# Patient Record
Sex: Male | Born: 1985 | Hispanic: No | Marital: Married | Smoking: Never smoker
Health system: Southern US, Community
[De-identification: ages and names within clinical notes are randomized; demographics above are authoritative.]

## PROBLEM LIST (undated history)

## (undated) DIAGNOSIS — E785 Hyperlipidemia, unspecified: Secondary | ICD-10-CM

## (undated) DIAGNOSIS — K219 Gastro-esophageal reflux disease without esophagitis: Secondary | ICD-10-CM

## (undated) DIAGNOSIS — L501 Idiopathic urticaria: Secondary | ICD-10-CM

## (undated) DIAGNOSIS — K589 Irritable bowel syndrome without diarrhea: Secondary | ICD-10-CM

## (undated) HISTORY — DX: Hyperlipidemia, unspecified: E78.5

## (undated) HISTORY — DX: Gastro-esophageal reflux disease without esophagitis: K21.9

## (undated) HISTORY — DX: Idiopathic urticaria: L50.1

## (undated) HISTORY — DX: Irritable bowel syndrome, unspecified: K58.9

---

## 2017-06-24 HISTORY — PX: ESOPHAGOGASTRODUODENOSCOPY: SHX1529

## 2017-09-22 HISTORY — PX: COLONOSCOPY: SHX174

## 2019-10-18 ENCOUNTER — Encounter: Payer: Self-pay | Admitting: Gastroenterology

## 2019-11-01 ENCOUNTER — Telehealth: Payer: Self-pay

## 2019-11-01 NOTE — Telephone Encounter (Signed)
Called patient and left voicemail to call back regarding OV

## 2019-11-02 ENCOUNTER — Other Ambulatory Visit: Payer: Self-pay

## 2019-11-02 ENCOUNTER — Ambulatory Visit (INDEPENDENT_AMBULATORY_CARE_PROVIDER_SITE_OTHER): Payer: 59 | Admitting: Gastroenterology

## 2019-11-02 ENCOUNTER — Encounter: Payer: Self-pay | Admitting: Gastroenterology

## 2019-11-02 VITALS — BP 122/80 | HR 84 | Temp 97.7°F | Ht 68.0 in | Wt 157.5 lb

## 2019-11-02 DIAGNOSIS — R140 Abdominal distension (gaseous): Secondary | ICD-10-CM

## 2019-11-02 DIAGNOSIS — K581 Irritable bowel syndrome with constipation: Secondary | ICD-10-CM | POA: Diagnosis not present

## 2019-11-02 MED ORDER — AMITRIPTYLINE HCL 10 MG PO TABS: 10.0000 mg | ORAL_TABLET | Freq: Every day | ORAL | 6 refills | Status: DC

## 2019-11-02 NOTE — Patient Instructions (Signed)
If you are age 36 or older, your body mass index should be between 23-30. Your Body mass index is 23.95 kg/m. If this is out of the aforementioned range listed, please consider follow up with your Primary Care Provider.  If you are age 65 or younger, your body mass index should be between 19-25. Your Body mass index is 23.95 kg/m. If this is out of the aformentioned range listed, please consider follow up with your Primary Care Provider.   Gluten Free Diet   Gluten-Free Diet for Celiac Disease, Adult  The gluten-free diet includes all foods that do not contain gluten. Gluten is a protein that is found in wheat, rye, barley, and some other grains. Following the gluten-free diet is the only treatment for people with celiac disease. It helps to prevent damage to the intestines and improves or eliminates the symptoms of celiac disease. Following the gluten-free diet requires some planning. It can be challenging at first, but it gets easier with time and practice. There are more gluten-free options available today than ever before. If you need help finding gluten-free foods or if you have questions, talk with your diet and nutrition specialist (registered dietitian) or your health care provider. What do I need to know about a gluten-free diet?  All fruits, vegetables, and meats are safe to eat and do not contain gluten.  When grocery shopping, start by shopping in the produce, meat, and dairy sections. These sections are more likely to contain gluten-free foods. Then move to the aisles that contain packaged foods if you need to.  Read all food labels. Gluten is often added to foods. Always check the ingredient list and look for warnings, such as "may contain gluten."  Talk with your dietitian or health care provider before taking a gluten-free multivitamin or mineral supplement.  Be aware of gluten-free foods having contact with foods that contain gluten (cross-contamination). This can happen at  home and with any processed foods. ? Talk with your health care provider or dietitian about how to reduce the risk of cross-contamination in your home. ? If you have questions about how a food is processed, ask the manufacturer. What key words help to identify gluten? Foods that list any of these key words on the label usually contain gluten:  Wheat, flour, enriched flour, bromated flour, white flour, durum flour, graham flour, phosphated flour, self-rising flour, semolina, farina, barley (malt), rye, and oats.  Starch, dextrin, modified food starch, or cereal.  Thickening, fillers, or emulsifiers.  Malt flavoring, malt extract, or malt syrup.  Hydrolyzed vegetable protein. In the [place] packaged foods that are gluten-free are required to be labeled "GF." These foods should be easy to identify and are safe to eat. In the [place] food companies are also required to list common food allergens, including wheat, on their labels. Recommended foods Grains  Amaranth, bean flours, 100% buckwheat flour, corn, millet, nut flours or nut meals, GF oats, quinoa, rice, sorghum, teff, rice wafers, pure cornmeal tortillas, popcorn, and hot cereals made from cornmeal. Hominy, rice, wild rice. Some Asian rice noodles or bean noodles. Arrowroot starch, corn bran, corn flour, corn germ, cornmeal, corn starch, potato flour, potato starch flour, and rice bran. Plain, brown, and sweet rice flours. Rice polish, soy flour, and tapioca starch. Vegetables  All plain fresh, frozen, and canned vegetables. Fruits  All plain fresh, frozen, canned, and dried fruits, and 100% fruit juices. Meats and other protein foods  All fresh beef, pork, poultry, fish, seafood, and eggs.  Fish canned in water, oil, brine, or vegetable broth. Plain nuts and seeds, peanut butter. Some lunch meat and some frankfurters. Dried beans, dried peas, and lentils. Dairy  Fresh plain, dry, evaporated, or condensed milk. Cream, butter, sour cream,  whipping cream, and most yogurts. Unprocessed cheese, most processed cheeses, some cottage cheese, some cream cheeses. Beverages  Coffee, tea, most herbal teas. Carbonated beverages and some root beers. Wine, sake, and distilled spirits, such as gin, vodka, and whiskey. Most hard ciders. Fats and oils  Butter, margarine, vegetable oil, hydrogenated butter, olive oil, shortening, lard, cream, and some mayonnaise. Some commercial salad dressings. Olives. Sweets and desserts  Sugar, honey, some syrups, molasses, jelly, and jam. Plain hard candy, marshmallows, and gumdrops. Pure cocoa powder. Plain chocolate. Custard and some pudding mixes. Gelatin desserts, sorbets, frozen ice pops, and sherbet. Cake, cookies, and other desserts prepared with allowed flours. Some commercial ice creams. Cornstarch, tapioca, and rice puddings. Seasoning and other foods  Some canned or frozen soups. Monosodium glutamate (MSG). Cider, rice, and wine vinegar. Baking soda and baking powder. Cream of tartar. Baking and nutritional yeast. Certain soy sauces made without wheat (ask your dietitian about specific brands that are allowed). Nuts, coconut, and chocolate. Salt, pepper, herbs, spices, flavoring extracts, imitation or artificial flavorings, natural flavorings, and food colorings. Some medicines and supplements. Some lip glosses and other cosmetics. Rice syrups. The items listed may not be a complete list. Talk with your dietitian about what dietary choices are best for you. Foods to avoid Grains  Barley, bran, bulgur, couscous, cracked wheat, [place], farro, graham, malt, matzo, semolina, wheat germ, and all wheat and rye cereals including spelt and kamut. Cereals containing malt as a flavoring, such as rice cereal. Noodles, spaghetti, macaroni, most packaged rice mixes, and all mixes containing wheat, rye, barley, or triticale. Vegetables  Most creamed vegetables and most vegetables canned in sauces. Some  commercially prepared vegetables and salads. Fruits  Thickened or prepared fruits and some pie fillings. Some fruit snacks and fruit roll-ups. Meats and other protein foods  Any meat or meat alternative containing wheat, rye, barley, or gluten stabilizers. These are often marinated or packaged meats and lunch meats. Bread-containing products, such as Swiss steak, croquettes, meatballs, and meatloaf. Most tuna canned in vegetable broth and [place] with hydrolyzed vegetable protein (HVP) injected as part of the basting. Seitan. Imitation fish. Eggs in sauces made from ingredients to avoid. Dairy  Commercial chocolate milk drinks and malted milk. Some non-dairy creamers. Any cheese product containing ingredients to avoid. Beverages  Certain cereal beverages. Beer, ale, malted milk, and some root beers. Some hard ciders. Some instant flavored coffees. Some herbal teas made with barley or with barley malt added. Fats and oils  Some commercial salad dressings. Sour cream containing modified food starch. Sweets and desserts  Some toffees. Chocolate-coated nuts (may be rolled in wheat flour) and some commercial candies and candy bars. Most cakes, cookies, donuts, pastries, and other baked goods. Some commercial ice cream. Ice cream cones. Commercially prepared mixes for cakes, cookies, and other desserts. Bread pudding and other puddings thickened with flour. Products containing brown rice syrup made with barley malt enzyme. Desserts and sweets made with malt flavoring. Seasoning and other foods  Some curry powders, some dry seasoning mixes, some gravy extracts, some meat sauces, some ketchups, some prepared mustards, and horseradish. Certain soy sauces. Malt vinegar. Bouillon and bouillon cubes that contain HVP. Some chip dips, and some chewing gum. Yeast extract. Brewer's yeast. Caramel color.  Some medicines and supplements. Some lip glosses and other cosmetics. The items listed may not be a complete  list. Talk with your dietitian about what dietary choices are best for you. Summary  Gluten is a protein that is found in wheat, rye, barley, and some other grains. The gluten-free diet includes all foods that do not contain gluten.  If you need help finding gluten-free foods or if you have questions, talk with your diet and nutrition specialist (registered dietitian) or your health care provider.  Read all food labels. Gluten is often added to foods. Always check the ingredient list and look for warnings, such as "may contain gluten." This information is not intended to replace advice given to you by your health care provider. Make sure you discuss any questions you have with your health care provider. Document Revised: 08/21/2017 Document Reviewed: 06/23/2016 Elsevier Patient Education  [place].  We have sent the following medications to your pharmacy for you to pick up at your convenience: Amitriptyline   Please call Dr. Leland Her nurse Karl Pock, RN)  in 2 weeks at [phone]  to let her now how you are doing.   Make an appointment with Dr. Nani Ravens to establish primary care.  [phone]  Follow up in 12 weeks.   Thank you,  Dr. Jackquline Denmark

## 2019-11-02 NOTE — Progress Notes (Signed)
Chief Complaint: Abdominal bloating  Referring Provider:  No ref. provider found      ASSESSMENT AND PLAN;   #1.  IBS with bloating. Neg colon 09/2017  #2.  H/O nonulcer dyspepsia.  Extensive GI work-up as below.  Plan: -Trial of gluten free diet x 4 weeks. -Trial of Amitryptline 10mg  po qhs (30), 6 refills. - Call in 2 weeks. - FU in 12 weeks.  If continues to have abdominal bloating, would consider CT AP, followed by SIBO breath test possibly followed by GES, if needed. - Appointment with Dr Nani Ravens (PCP) - routine PE and to get established.    HPI:    Bradley Tucker is a 34 y.o. male  For follow-up visit Had colonoscopy in [place] 09/2017.  He brings in pictures.  There it was normal to TI.  Main problem is that of postprandial abdominal bloating, gets worse as the day progresses.  Does have history of alternating diarrhea and constipation which is better now.  He has been on high-fiber diet. No nausea, vomiting, heartburn, regurgitation, odynophagia or dysphagia. No melena or hematochezia. No unintentional weight loss. No abdominal pain.  Foods like Curry, fruits like strawberry, dry fruits would make abdominal bloating worse.  Wheat and bread would make symptoms worse as well.  He has been tested negative for celiac disease in the past.  No history suggestive of lactose intolerance.  Did tell me that stress occasionally would make it worse.  Has history of nonulcer dyspepsia-extensive GI work-up as below.  Had normal CBC, CMP and HP antibody 09/2016.  He was given trial of omeprazole followed by Nexium followed by Dexilant without any significant relief.  Denies having any upper GI symptoms currently.  Would also like to get established with a primary care physician.  Previously cholesterol was borderline high.  Sinus - takes zyrtec  No sodas, chocolates, chewing gums, artificial sweeteners and candy. No NSAIDs    [facility] proc -Colonoscopy to TI 09/2017 (in [place]).  Report sent for scanning.  Negative to TI. -EGD with SB Bx 06/2017: Mild gastritis.  Otherwise normal EGD.  Neg HP and SB bx. -[place] Abdo complete 06/2017: Nl -HIDA with EF 07/2017: Nl EF 57%   Past Medical History:  Diagnosis Date  . Chronic idiopathic urticaria   . IBS (irritable bowel syndrome)     Past Surgical History:  Procedure Laterality Date  . COLONOSCOPY  09/2017   [place]  . ESOPHAGOGASTRODUODENOSCOPY     Dr Garrel Ridgel    Family History  Problem Relation Age of Onset  . Colon cancer Neg Hx   . Esophageal cancer Neg Hx     Social History   Tobacco Use  . Smoking status: Never Smoker  . Smokeless tobacco: Never Used  Substance Use Topics  . Alcohol use: Yes    Comment: ocassionally   . Drug use: Never    Current Outpatient Medications  Medication Sig Dispense Refill  . levocetirizine (XYZAL) 5 MG tablet Take 5 mg by mouth every evening.    . Omega-3 Fatty Acids (FISH OIL PO) Take 1 tablet by mouth daily.     No current facility-administered medications for this visit.    Not on File  Review of Systems:  Constitutional: Denies fever, chills, diaphoresis, appetite change and fatigue.  HEENT: Denies photophobia, eye pain, redness, hearing loss, ear pain, congestion, sore throat, rhinorrhea, sneezing, mouth sores, neck pain, neck stiffness and tinnitus.   Respiratory: Denies SOB, DOE, cough, chest  tightness,  and wheezing.   Cardiovascular: Denies chest pain, palpitations and leg swelling.  Genitourinary: Denies dysuria, urgency, frequency, hematuria, flank pain and difficulty urinating.  Musculoskeletal: Denies myalgias, back pain, joint swelling, arthralgias and gait problem.  Skin: No rash.  Neurological: Denies dizziness, seizures, syncope, weakness, light-headedness, numbness and headaches.  Hematological: Denies adenopathy. Easy bruising, personal or family bleeding history  Psychiatric/Behavioral: No depression.  Mild anxiety.     Physical  Exam:    BP 122/80   Pulse 84   Temp 97.7 F (36.5 C)   Ht 5\' 8"  (1.727 m)   Wt 157 lb 8 oz (71.4 kg)   BMI 23.95 kg/m  Wt Readings from Last 3 Encounters:  11/02/19 157 lb 8 oz (71.4 kg)   Constitutional:  Well-developed, in no acute distress. Psychiatric: Normal mood and affect. Behavior is normal. HEENT: Pupils normal.  Conjunctivae are normal. No scleral icterus. Neck supple.  Cardiovascular: Normal rate, regular rhythm. No edema Pulmonary/chest: Effort normal and breath sounds normal. No wheezing, rales or rhonchi. Abdominal: Soft, nondistended. Nontender. Bowel sounds active throughout. There are no masses palpable. No hepatomegaly.  Slightly distended. Rectal:  defered Neurological: Alert and oriented to person place and time. Skin: Skin is warm and dry. No rashes noted.   12/31/19, MD 11/02/2019, 2:44 PM  Cc: No ref. provider found

## 2019-11-30 ENCOUNTER — Telehealth: Payer: Self-pay | Admitting: Gastroenterology

## 2019-11-30 DIAGNOSIS — R140 Abdominal distension (gaseous): Secondary | ICD-10-CM

## 2019-11-30 DIAGNOSIS — R109 Unspecified abdominal pain: Secondary | ICD-10-CM

## 2019-12-01 NOTE — Telephone Encounter (Signed)
Called and spoke with patient-Patient reports he was to call in 4 weeks and give an update on symptoms -he has been following the gluten free diet (as best he can) -he is not having a resolution of symptoms-abd pain/bloating (had two days where he did not follow diet where symptoms were worse) -is taking the medication as prescribed-[place]av[place] Patient is requesting to know the next step in plan of care  Please advise

## 2019-12-02 NOTE — Telephone Encounter (Signed)
Still having abdominal pain/bloating  Plan: -As per last note, proceed with CT Abdo/pelvis with p.o. and IV contrast -Check CBC, CMP, lipase, C-reactive protein. -Follow-up thereafter.  RG

## 2019-12-02 NOTE — Telephone Encounter (Signed)
Called and spoke with patient-patient informed of MD recommendations; patient is agreeable with plan of care and has agreed to complete lab work as soon as possible and to have the CT scan on 12/09/2019 at 8:00 am; patient has requested instructions for CT scan be sent to his MyChart account; instructions sent; patient is to pick up the contrast prior to the day of his scan; Patient verbalized understanding of information/instructions;  Patient was advised to call the office at [phone] if questions/concerns arise;

## 2019-12-06 ENCOUNTER — Ambulatory Visit (INDEPENDENT_AMBULATORY_CARE_PROVIDER_SITE_OTHER): Payer: 59 | Admitting: Family Medicine

## 2019-12-06 ENCOUNTER — Other Ambulatory Visit: Payer: Self-pay

## 2019-12-06 ENCOUNTER — Encounter: Payer: Self-pay | Admitting: Family Medicine

## 2019-12-06 VITALS — BP 124/72 | HR 72 | Temp 96.7°F | Ht 68.0 in | Wt 157.2 lb

## 2019-12-06 DIAGNOSIS — Z0000 Encounter for general adult medical examination without abnormal findings: Secondary | ICD-10-CM

## 2019-12-06 NOTE — Patient Instructions (Addendum)
Give [place] 2-3 business days to get the results of your labs back.   Keep the diet clean and stay active.  Do monthly self testicular checks in the shower. You are feeling for lumps/bumps that don't belong. If you feel anything like this, let me know!  Consider Flonase for allergies.   Bring your blood pressure monitor and readings to your nurse visit in 2 weeks.   Let us know if you need anything.  Knee Exercises It is normal to feel mild stretching, pulling, tightness, or discomfort as you do these exercises, but you should stop right away if you feel sudden pain or your pain gets worse. STRETCHING AND RANGE OF MOTION EXERCISES  These exercises warm up your muscles and joints and improve the movement and flexibility of your knee. These exercises also help to relieve pain, numbness, and tingling. Exercise A: Knee Extension, Prone  1. Lie on your abdomen on a bed. 2. Place your left / right knee just beyond the edge of the surface so your knee is not on the bed. You can put a towel under your left / right thigh just above your knee for comfort. 3. Relax your leg muscles and allow gravity to straighten your knee. You should feel a stretch behind your left / right knee. 4. Hold this position for 30 seconds. 5. Scoot up so your knee is supported between repetitions. Repeat 2 times. Complete this stretch 3 times per week. Exercise B: Knee Flexion, Active    1. Lie on your back with both knees straight. If this causes back discomfort, bend your left / right knee so your foot is flat on the floor. 2. Slowly slide your left / right heel back toward your buttocks until you feel a gentle stretch in the front of your knee or thigh. 3. Hold this position for 30 seconds. 4. Slowly slide your left / right heel back to the starting position. Repeat 2 times. Complete this exercise 3 times per week. Exercise C: Quadriceps, Prone    1. Lie on your abdomen on a firm surface, such as a bed or padded  floor. 2. Bend your left / right knee and hold your ankle. If you cannot reach your ankle or pant leg, loop a belt around your foot and grab the belt instead. 3. Gently pull your heel toward your buttocks. Your knee should not slide out to the side. You should feel a stretch in the front of your thigh and knee. 4. Hold this position for 30 seconds. Repeat 2 times. Complete this stretch 3 times per week. Exercise D: Hamstring, Supine  1. Lie on your back. 2. Loop a belt or towel over the ball of your left / right foot. The ball of your foot is on the walking surface, right under your toes. 3. Straighten your left / right knee and slowly pull on the belt to raise your leg until you feel a gentle stretch behind your knee. ? Do not let your left / right knee bend while you do this. ? Keep your other leg flat on the floor. 4. Hold this position for 30 seconds. Repeat 2 times. Complete this stretch 3 times per week. STRENGTHENING EXERCISES  These exercises build strength and endurance in your knee. Endurance is the ability to use your muscles for a long time, even after they get tired. Exercise E: Quadriceps, Isometric    1. Lie on your back with your left / right leg extended and your other  knee bent. Put a rolled towel or small pillow under your knee if told by your health care provider. 2. Slowly tense the muscles in the front of your left / right thigh. You should see your kneecap slide up toward your hip or see increased dimpling just above the knee. This motion will push the back of the knee toward the floor. 3. For 3 seconds, keep the muscle as tight as you can without increasing your pain. 4. Relax the muscles slowly and completely. Repeat for 10 total reps Repeat 2 ti mes. Complete this exercise 3 times per week. Exercise F: Straight Leg Raises - Quadriceps  1. Lie on your back with your left / right leg extended and your other knee bent. 2. Tense the muscles in the front of your left /  right thigh. You should see your kneecap slide up or see increased dimpling just above the knee. Your thigh may even shake a bit. 3. Keep these muscles tight as you raise your leg 4-6 inches (10-15 cm) off the floor. Do not let your knee bend. 4. Hold this position for 3 seconds. 5. Keep these muscles tense as you lower your leg. 6. Relax your muscles slowly and completely after each repetition. 10 total reps. Repeat 2 times. Complete this exercise 3 times per week.  Exercise G: Hamstring Curls    If told by your health care provider, do this exercise while wearing ankle weights. Begin with 5 lb weights (optional). Then increase the weight by 1 lb (0.5 kg) increments. Do not wear ankle weights that are more than 20 lbs to start with. 1. Lie on your abdomen with your legs straight. 2. Bend your left / right knee as far as you can without feeling pain. Keep your hips flat against the floor. 3. Hold this position for 3 seconds. 4. Slowly lower your leg to the starting position. Repeat for 10 reps.  Repeat 2 times. Complete this exercise 3 times per week. Exercise H: Squats (Quadriceps)  1. Stand in front of a table, with your feet and knees pointing straight ahead. You may rest your hands on the table for balance but not for support. 2. Slowly bend your knees and lower your hips like you are going to sit in a chair. ? Keep your weight over your heels, not over your toes. ? Keep your lower legs upright so they are parallel with the table legs. ? Do not let your hips go lower than your knees. ? Do not bend lower than told by your health care provider. ? If your knee pain increases, do not bend as low. 3. Hold the squat position for 1 second. 4. Slowly push with your legs to return to standing. Do not use your hands to pull yourself to standing. Repeat 2 times. Complete this exercise 3 times per week. Exercise I: Wall Slides (Quadriceps)    1. Lean your back against a smooth wall or door  while you walk your feet out 18-24 inches (46-61 cm) from it. 2. Place your feet hip-width apart. 3. Slowly slide down the wall or door until your knees Repeat 2 times. Complete this exercise every other day. 4. Exercise K: Straight Leg Raises - Hip Abductors  1. Lie on your side with your left / right leg in the top position. Lie so your head, shoulder, knee, and hip line up. You may bend your bottom knee to help you keep your balance. 2. Roll your hips slightly forward so  your hips are stacked directly over each other and your left / right knee is facing forward. 3. Leading with your heel, lift your top leg 4-6 inches (10-15 cm). You should feel the muscles in your outer hip lifting. ? Do not let your foot drift forward. ? Do not let your knee roll toward the ceiling. 4. Hold this position for 3 seconds. 5. Slowly return your leg to the starting position. 6. Let your muscles relax completely after each repetition. 10 total reps. Repeat 2 times. Complete this exercise 3 times per week. Exercise J: Straight Leg Raises - Hip Extensors  1. Lie on your abdomen on a firm surface. You can put a pillow under your hips if that is more comfortable. 2. Tense the muscles in your buttocks and lift your left / right leg about 4-6 inches (10-15 cm). Keep your knee straight as you lift your leg. 3. Hold this position for 3 seconds. 4. Slowly lower your leg to the starting position. 5. Let your leg relax completely after each repetition. Repeat 2 times. Complete this exercise 3 times per week. Document Released: 07/23/2005 Document Revised: 06/02/2016 Document Reviewed: 07/15/2015 Elsevier Interactive Patient Education  2017 [employer].

## 2019-12-06 NOTE — Progress Notes (Signed)
Chief Complaint  Patient presents with  . New Patient (Initial Visit)    Well Male Bradley Tucker is here for a complete physical.   His last physical was >1 year ago.  Current diet: in general, a "healthy" diet.   Current exercise: walking, some wt resistance Weight trend: stable Daytime fatigue? No. Seat belt? Yes.    Health maintenance Tetanus- Yes HIV- Yes  Past Medical History:  Diagnosis Date  . Chronic idiopathic urticaria   . GERD (gastroesophageal reflux disease)   . IBS (irritable bowel syndrome)      Past Surgical History:  Procedure Laterality Date  . [place]ONOSCOPY  09/2017   [place]  . ESOPHAGOGASTRODUODENOSCOPY  06/24/2017   Mild gastritis. Otherwise normal EGD    Medications  Current Outpatient Medications on File Prior to Visit  Medication Sig Dispense Refill  . amitriptyline (ELAVIL) 10 MG tablet Take 1 tablet (10 mg total) by mouth at bedtime. 30 tablet 6  . levocetirizine (XYZAL) 5 MG tablet Take 5 mg by mouth every evening.    . Omega-3 Fatty Acids (FISH OIL PO) Take 1 tablet by mouth daily.     Allergies No Known Allergies  Family History Family History  Problem Relation Age of Onset  . Asthma Mother   . Colon cancer Neg Hx   . Esophageal cancer Neg Hx     Review of Systems: Constitutional: no fevers or chills Eye:  no recent significant change in vision Ear/Nose/Mouth/Throat:  Ears:  no hearing loss Nose/Mouth/Throat:  no complaints of nasal congestion, no sore throat Cardiovascular:  no chest pain Respiratory:  no shortness of breath Gastrointestinal:  no current abdominal pain GU:  Male: negative for dysuria, frequency, and incontinence Musculoskeletal/Extremities: +intermittent L knee pain; otherwise no pain of the joints Integumentary (Skin/Breast):  no abnormal skin lesions reported Neurologic:  no headaches Endocrine: No unexpected weight changes Hematologic/Lymphatic:  no night sweats  Exam BP 124/72 (BP Location: Left  Arm, Patient Position: Sitting, Cuff Size: Normal)   Pulse 72   Temp (!) 96.7 F (35.9 C) (Temporal)   Ht 5\' 8"  (1.727 m)   Wt 157 lb 4 oz (71.3 kg)   SpO2 96%   BMI 23.91 kg/m  General:  well developed, well nourished, in no apparent distress Skin:  no significant moles, warts, or growths Head:  no masses, lesions, or tenderness Eyes:  pupils equal and round, sclera anicteric without injection Ears:  canals without lesions, TMs shiny without retraction, no obvious effusion, no erythema Nose:  nares patent, septum midline, mucosa normal Throat/Pharynx:  lips and gingiva without lesion; tongue and uvula midline; non-inflamed pharynx; no exudates or postnasal drainage Neck: neck supple without adenopathy, thyromegaly, or masses Lungs:  clear to auscultation, breath sounds equal bilaterally, no respiratory distress Cardio:  regular rate and rhythm, no bruits, no LE edema Abdomen:  abdomen soft, nontender; bowel Rectal: Deferred Musculoskeletal:  symmetrical muscle groups noted without atrophy or deformity Extremities:  no clubbing, cyanosis, or edema, no deformities, no skin discoloration Neuro:  gait normal; deep tendon reflexes normal and symmetric Psych: well oriented with normal range of affect and appropriate judgment/insight  Assessment and Plan  Well adult exam - Plan: CBC, Comprehensive metabolic panel, Lipid panel   Well 34 y.o. male. Counseled on diet and exercise. Self testicular exams recommended at least monthly.  Other orders as above. Return in 2 weeks for nurse BP visit as he has been checking BP at home and it has been high. Bring  monitor to verify how accurate it is.  Follow up in 1 year pending the above workup. The patient voiced understanding and agreement to the plan.  [place], DO 12/06/19 3:02 PM

## 2019-12-07 ENCOUNTER — Other Ambulatory Visit: Payer: Self-pay | Admitting: Family Medicine

## 2019-12-07 DIAGNOSIS — E7849 Other hyperlipidemia: Secondary | ICD-10-CM

## 2019-12-07 LAB — COMPREHENSIVE METABOLIC PANEL
ALT: 13 U/L (ref 0–53)
AST: 14 U/L (ref 0–37)
Albumin: 4.2 g/dL (ref 3.5–5.2)
Alkaline Phosphatase: 36 U/L — ABNORMAL LOW (ref 39–117)
BUN: 20 mg/dL (ref 6–23)
CO2: 32 mEq/L (ref 19–32)
Calcium: 9.3 mg/dL (ref 8.4–10.5)
Chloride: 103 mEq/L (ref 96–112)
Creatinine, Ser: 1.02 mg/dL (ref 0.40–1.50)
GFR: 82.99 mL/min (ref >60.00)
Glucose, Bld: 81 mg/dL (ref 70–99)
Potassium: 4.1 mEq/L (ref 3.5–5.1)
Sodium: 140 mEq/L (ref 135–145)
Total Bilirubin: 0.3 mg/dL (ref 0.2–1.2)
Total Protein: 6.6 g/dL (ref 6.0–8.3)

## 2019-12-07 LAB — CBC
HCT: 41.6 % (ref 39.0–52.0)
Hemoglobin: 13.9 g/dL (ref 13.0–17.0)
MCHC: 33.5 g/dL (ref 30.0–36.0)
MCV: 84.8 fl (ref 78.0–100.0)
Platelets: 225 10*3/uL (ref 150.0–400.0)
RBC: 4.91 Mil/uL (ref 4.22–5.81)
RDW: 12.9 % (ref 11.5–15.5)
WBC: 4.6 10*3/uL (ref 4.0–10.5)

## 2019-12-07 LAB — LIPID PANEL
Cholesterol: 204 mg/dL — ABNORMAL HIGH (ref 0–200)
HDL: 46 mg/dL (ref >39.00)
LDL Cholesterol: 128 mg/dL — ABNORMAL HIGH (ref 0–99)
NonHDL: 157.53
Total CHOL/HDL Ratio: 4
Triglycerides: 147 mg/dL (ref 0.0–149.0)
VLDL: 29.4 mg/dL (ref 0.0–40.0)

## 2019-12-09 ENCOUNTER — Ambulatory Visit (HOSPITAL_BASED_OUTPATIENT_CLINIC_OR_DEPARTMENT_OTHER)
Admission: RE | Admit: 2019-12-09 | Discharge: 2019-12-09 | Disposition: A | Discharge status: Home / Self Care | Payer: 59 | Source: Ambulatory Visit | Attending: Gastroenterology | Admitting: Gastroenterology

## 2019-12-09 ENCOUNTER — Other Ambulatory Visit: Payer: Self-pay

## 2019-12-09 ENCOUNTER — Encounter (HOSPITAL_BASED_OUTPATIENT_CLINIC_OR_DEPARTMENT_OTHER): Payer: Self-pay

## 2019-12-09 DIAGNOSIS — R140 Abdominal distension (gaseous): Secondary | ICD-10-CM | POA: Insufficient documentation

## 2019-12-09 DIAGNOSIS — R109 Unspecified abdominal pain: Secondary | ICD-10-CM | POA: Insufficient documentation

## 2019-12-09 MED ORDER — IOHEXOL 300 MG/ML  SOLN
100.0000 mL | Freq: Once | INTRAMUSCULAR | Status: AC | PRN
  Administered 2019-12-09: 100 mL via INTRAVENOUS

## 2020-04-10 ENCOUNTER — Telehealth: Payer: Self-pay | Admitting: Gastroenterology

## 2020-04-10 NOTE — Telephone Encounter (Signed)
Spoke to patient to let him know that he may pick up his SIBO breath test at the [facility] 3rd floor [address] location. Orders have been faxed to [facility]. He wasadvised to call with any questions. Patient voiced understanding.

## 2020-04-10 NOTE — Telephone Encounter (Signed)
Patient is calling to schedule breath test

## 2020-04-27 ENCOUNTER — Telehealth: Payer: Self-pay | Admitting: Gastroenterology

## 2020-04-27 NOTE — Telephone Encounter (Signed)
Spoke with Susy Frizzle at [facility]. He will fax to form over for Dr Urban Gibson signature and DX code.

## 2020-05-07 NOTE — Telephone Encounter (Signed)
Form has been faxed to [facility]

## 2020-05-16 ENCOUNTER — Other Ambulatory Visit: Payer: Self-pay | Admitting: Gastroenterology

## 2020-05-16 MED ORDER — RIFAXIMIN 200 MG PO TABS: 200.0000 mg | ORAL_TABLET | Freq: Three times a day (TID) | ORAL | 0 refills | Status: DC

## 2020-05-16 NOTE — Progress Notes (Signed)
Patient SIBO breath test analysis came back positive for bacteria overgrowth. Per Dr Chales Abrahams patient will start Xifaxin 550 mg TID for 14 days. Prescription sent to his pharmacy. Patient notified and voiced understanding.

## 2020-05-17 ENCOUNTER — Other Ambulatory Visit: Payer: Self-pay | Admitting: Gastroenterology

## 2020-05-17 MED ORDER — RIFAXIMIN 550 MG PO TABS: 550.0000 mg | ORAL_TABLET | Freq: Three times a day (TID) | ORAL | 0 refills | Status: AC

## 2020-05-18 ENCOUNTER — Telehealth: Payer: Self-pay | Admitting: Gastroenterology

## 2020-05-18 NOTE — Telephone Encounter (Signed)
Spoke to patient. He is on his was to pick up samples of Xifaxin

## 2020-05-18 NOTE — Telephone Encounter (Signed)
Pls call pt. He just called stating that he was returning your call.

## 2020-08-06 ENCOUNTER — Other Ambulatory Visit: Payer: 59

## 2020-08-06 ENCOUNTER — Ambulatory Visit (INDEPENDENT_AMBULATORY_CARE_PROVIDER_SITE_OTHER): Payer: 59 | Admitting: Gastroenterology

## 2020-08-06 ENCOUNTER — Encounter: Payer: Self-pay | Admitting: Gastroenterology

## 2020-08-06 VITALS — BP 128/82 | HR 89 | Ht 68.0 in | Wt 153.5 lb

## 2020-08-06 DIAGNOSIS — R140 Abdominal distension (gaseous): Secondary | ICD-10-CM

## 2020-08-06 DIAGNOSIS — K580 Irritable bowel syndrome with diarrhea: Secondary | ICD-10-CM

## 2020-08-06 DIAGNOSIS — K30 Functional dyspepsia: Secondary | ICD-10-CM

## 2020-08-06 MED ORDER — RIFAXIMIN 550 MG PO TABS: 550.0000 mg | ORAL_TABLET | Freq: Three times a day (TID) | ORAL | 0 refills | Status: DC

## 2020-08-06 NOTE — Progress Notes (Signed)
Chief Complaint: Abdominal bloating  Referring Provider:  Sharlene Dory*      ASSESSMENT AND PLAN;   #1.  IBS-D with bloating. Neg colon to TI 09/2017. H/O SIBO 04/2020 on H2 breath test, s/p course of rifaxamin x 2 weeks. Neg CT AP 11/2019, Nl TSH, neg food allergy testing (Dr Elijah Birk). Failed low dose amitryptline  #2.  H/O nonulcer dyspepsia.  Extensive neg GI WU as below.  Plan: -Proceed with solid-phase GES -Stool studies for GI Pathogen (includes C. Diff), giardia antigen, O&P, fecal elastase, fat and Calprotectin. -Second course of Rifaxamin 550mg  po TID x 2 weeks. - RTC in 24 weeks. If still with problems, rpt SIBO breath test.   HPI:    Bradley Tucker is a 34 y.o. male  For follow-up visit  Did very well after course of rifaximin for 2 weeks.  Then again started having intermittent postprandial diarrhea without any nocturnal symptoms.  Symptoms get worse when he eats raw vegetables/fruits.  Mostly complains of bloating, which does get worse as the day progresses.  He has been sleeping very well.  No weight loss.  Had colonoscopy in 29 09/2017.  He brings in pictures.  There it was normal to TI.  No nausea, vomiting, heartburn, regurgitation, odynophagia or dysphagia. No melena or hematochezia. No unintentional weight loss. No abdominal pain.  Foods like Curry, fruits like strawberry, dry fruits would make abdominal bloating worse.  Wheat and bread would make symptoms worse as well.  He has been tested negative for celiac disease in the past.  No history suggestive of lactose intolerance.  Did tell me that stress occasionally would make it worse.  Has history of nonulcer dyspepsia-extensive GI work-up as below.  Had normal CBC, CMP and HP antibody 09/2016.  He was given trial of omeprazole followed by Nexium followed by Dexilant without any significant relief.  Denies having any upper GI symptoms currently.  No sodas, chocolates, chewing gums,  artificial sweeteners and candy. No NSAIDs  His labs including CBC, CMP, TSH were all within normal limits.    [facility] proc -Colonoscopy to TI 09/2017 (in 10/2017).  Report sent for scanning.  Negative to TI. -EGD with SB Bx 06/2017: Mild gastritis.  Otherwise normal EGD.  Neg HP and SB bx. -07/2017 Abdo complete 06/2017: Nl -HIDA with EF 07/2017: Nl EF 57% -CT AP 12/09/2019: Negative for any acute abnormalities. -SIBO breath test 04/2020 suspicious for bacterial overgrowth. -Being followed by allergy at [facility]. Dx with chronic idiopathic urticaria   Past Medical History:  Diagnosis Date  . Chronic idiopathic urticaria   . GERD (gastroesophageal reflux disease)   . IBS (irritable bowel syndrome)     Past Surgical History:  Procedure Laterality Date  . COLONOSCOPY  09/2017   10/2017  . ESOPHAGOGASTRODUODENOSCOPY  06/24/2017   Mild gastritis. Otherwise normal EGD    Family History  Problem Relation Age of Onset  . Asthma Mother   . Colon cancer Neg Hx   . Esophageal cancer Neg Hx     Social History   Tobacco Use  . Smoking status: Never Smoker  . Smokeless tobacco: Never Used  Vaping Use  . Vaping Use: Never used  Substance Use Topics  . Alcohol use: Yes    Comment: ocassionally   . Drug use: Never    Current Outpatient Medications  Medication Sig Dispense Refill  . levocetirizine (XYZAL) 5 MG tablet Take 5 mg by mouth 2 (two) times daily.     08/24/2017  Omega-3 Fatty Acids (FISH OIL PO) Take 1 tablet by mouth daily.     No current facility-administered medications for this visit.    No Known Allergies  Review of Systems:  Psychiatric/Behavioral: No depression.  Mild anxiety.     Physical Exam:    BP 128/82   Pulse 89   Ht 5\' 8"  (1.727 m)   Wt 153 lb 8 oz (69.6 kg)   BMI 23.34 kg/m  Wt Readings from Last 3 Encounters:  08/06/20 153 lb 8 oz (69.6 kg)  12/06/19 157 lb 4 oz (71.3 kg)  11/02/19 157 lb 8 oz (71.4 kg)   Constitutional:  Well-developed, in no  acute distress. Psychiatric: Normal mood and affect. Behavior is normal. HEENT: Pupils normal.  Conjunctivae are normal. No scleral icterus. Neck supple.  Cardiovascular: Normal rate, regular rhythm. No edema Pulmonary/chest: Effort normal and breath sounds normal. No wheezing, rales or rhonchi. Abdominal: Soft, nondistended. Nontender. Bowel sounds active throughout. There are no masses palpable. No hepatomegaly.  Slightly distended. Rectal:  defered Neurological: Alert and oriented to person place and time. Skin: Skin is warm and dry. No rashes noted.   12/31/19, MD 08/06/2020, 2:15 PM  Cc: 08/08/2020*

## 2020-08-06 NOTE — Patient Instructions (Signed)
If you are age 36 or older, your body mass index should be between 23-30. Your Body mass index is 23.34 kg/m. If this is out of the aforementioned range listed, please consider follow up with your Primary Care Provider.  If you are age 52 or younger, your body mass index should be between 19-25. Your Body mass index is 23.34 kg/m. If this is out of the aformentioned range listed, please consider follow up with your Primary Care Provider.   You have been scheduled for a gastric emptying scan at [facility] on 08/24/20 at 7:30am. Please arrive at least 15 minutes prior to your appointment for registration. Please make certain not to have anything to eat or drink after midnight the night before your test. Hold all stomach medications (ex: Zofran, phenergan, Reglan) 48 hours prior to your test. If you need to reschedule your appointment, please contact radiology scheduling at [phone]. _____________________________________________________________________ A gastric-emptying study measures how long it takes for food to move through your stomach. There are several ways to measure stomach emptying. In the most common test, you eat food that contains a small amount of radioactive material. A scanner that detects the movement of the radioactive material is placed over your abdomen to monitor the rate at which food leaves your stomach. This test normally takes about 4 hours to complete. _____________________________________________________________________  We have sent the following medications to your pharmacy for you to pick up at your convenience: Rifaximin   We have given you samples of the following medication to take: Rifaximin  Please go to the lab at [facility] ([address] [place] You will need to go to level "B", you do not need an appointment for this. Hours available are 7:30 am - 4:30 pm.    Thank you,  Dr. Lynann Bologna

## 2020-08-09 LAB — GI PROFILE, STOOL, PCR

## 2020-08-13 LAB — GIARDIA ANTIGEN
MICRO NUMBER:: 11203504
RESULT:: NOT DETECTED
SPECIMEN QUALITY:: ADEQUATE

## 2020-08-13 LAB — PANCREATIC ELASTASE, FECAL: Pancreatic Elastase-1, Stool: 172 mcg/g — ABNORMAL LOW

## 2020-08-13 LAB — OVA AND PARASITE EXAMINATION
CONCENTRATE RESULT:: NONE SEEN
MICRO NUMBER:: 11203505
SPECIMEN QUALITY:: ADEQUATE
TRICHROME RESULT:: NONE SEEN

## 2020-08-13 LAB — FECAL FAT, QUALITATIVE: FECAL FAT, QUALITATIVE: NORMAL

## 2020-08-24 ENCOUNTER — Encounter (HOSPITAL_COMMUNITY): Payer: 59

## 2020-08-29 ENCOUNTER — Ambulatory Visit (HOSPITAL_COMMUNITY)
Admission: RE | Admit: 2020-08-29 | Discharge: 2020-08-29 | Disposition: A | Discharge status: Home / Self Care | Payer: 59 | Source: Ambulatory Visit | Attending: Gastroenterology | Admitting: Gastroenterology

## 2020-08-29 ENCOUNTER — Other Ambulatory Visit: Payer: Self-pay

## 2020-08-29 DIAGNOSIS — K30 Functional dyspepsia: Secondary | ICD-10-CM | POA: Diagnosis present

## 2020-08-29 DIAGNOSIS — R140 Abdominal distension (gaseous): Secondary | ICD-10-CM | POA: Diagnosis present

## 2020-08-29 DIAGNOSIS — K580 Irritable bowel syndrome with diarrhea: Secondary | ICD-10-CM | POA: Diagnosis not present

## 2020-08-29 MED ORDER — TECHNETIUM TC 99M SULFUR COLLOID
1.9000 | Freq: Once | INTRAVENOUS | Status: AC | PRN
  Administered 2020-08-29: 1.9 via INTRAVENOUS

## 2020-12-03 ENCOUNTER — Ambulatory Visit (INDEPENDENT_AMBULATORY_CARE_PROVIDER_SITE_OTHER): Payer: 59 | Admitting: Family Medicine

## 2020-12-03 ENCOUNTER — Encounter: Payer: Self-pay | Admitting: Family Medicine

## 2020-12-03 ENCOUNTER — Other Ambulatory Visit: Payer: Self-pay

## 2020-12-03 VITALS — BP 112/80 | HR 80 | Temp 97.7°F | Resp 17 | Ht 67.0 in | Wt 147.0 lb

## 2020-12-03 DIAGNOSIS — Z1159 Encounter for screening for other viral diseases: Secondary | ICD-10-CM

## 2020-12-03 DIAGNOSIS — E7849 Other hyperlipidemia: Secondary | ICD-10-CM

## 2020-12-03 DIAGNOSIS — Z0000 Encounter for general adult medical examination without abnormal findings: Secondary | ICD-10-CM | POA: Diagnosis not present

## 2020-12-03 LAB — COMPREHENSIVE METABOLIC PANEL
ALT: 19 U/L (ref 0–53)
AST: 17 U/L (ref 0–37)
Albumin: 4.6 g/dL (ref 3.5–5.2)
Alkaline Phosphatase: 42 U/L (ref 39–117)
BUN: 22 mg/dL (ref 6–23)
CO2: 31 mEq/L (ref 19–32)
Calcium: 9.9 mg/dL (ref 8.4–10.5)
Chloride: 101 mEq/L (ref 96–112)
Creatinine, Ser: 0.98 mg/dL (ref 0.40–1.50)
GFR: 99.39 mL/min (ref >60.00)
Glucose, Bld: 90 mg/dL (ref 70–99)
Potassium: 4.9 mEq/L (ref 3.5–5.1)
Sodium: 140 mEq/L (ref 135–145)
Total Bilirubin: 0.5 mg/dL (ref 0.2–1.2)
Total Protein: 7.1 g/dL (ref 6.0–8.3)

## 2020-12-03 LAB — LIPID PANEL
Cholesterol: 198 mg/dL (ref 0–200)
HDL: 55.8 mg/dL (ref >39.00)
LDL Cholesterol: 131 mg/dL — ABNORMAL HIGH (ref 0–99)
NonHDL: 142.36
Total CHOL/HDL Ratio: 4
Triglycerides: 59 mg/dL (ref 0.0–149.0)
VLDL: 11.8 mg/dL (ref 0.0–40.0)

## 2020-12-03 MED ORDER — LEVOCETIRIZINE DIHYDROCHLORIDE 5 MG PO TABS
5.0000 mg | ORAL_TABLET | Freq: Two times a day (BID) | ORAL | 2 refills | Status: DC
Start: 2020-12-03 — End: 2021-11-18

## 2020-12-03 NOTE — Progress Notes (Signed)
Chief Complaint  Patient presents with  . Follow-up    Basic labs  . Bloated    Seeing GI    Well Male Bradley Tucker is here for a complete physical.   His last physical was >1 year ago.  Current diet: in general, a "healthy" diet.   Current exercise: lifting, cardio, stretching Weight trend: stable Fatigue out of ordinary? No. Seat belt? Yes.   Loss of interested in doing things or depression in past 2 weeks? No  Health maintenance Tetanus- Yes HIV- Yes Hep C- No  Past Medical History:  Diagnosis Date  . Chronic idiopathic urticaria   . GERD (gastroesophageal reflux disease)   . IBS (irritable bowel syndrome)      Past Surgical History:  Procedure Laterality Date  . [place]ONOSCOPY  09/2017   [place]  . ESOPHAGOGASTRODUODENOSCOPY  06/24/2017   Mild gastritis. Otherwise normal EGD    Medications  Current Outpatient Medications on File Prior to Visit  Medication Sig Dispense Refill  . levocetirizine (XYZAL) 5 MG tablet Take 5 mg by mouth 2 (two) times daily.     . Omega-3 Fatty Acids (FISH OIL PO) Take 1 tablet by mouth daily.     Allergies No Known Allergies  Family History Family History  Problem Relation Age of Onset  . Asthma Mother   . Colon cancer Neg Hx   . Esophageal cancer Neg Hx     Review of Systems: Constitutional: no fevers or chills Eye:  no recent significant change in vision Ear/Nose/Mouth/Throat:  Ears:  no hearing loss Nose/Mouth/Throat:  no complaints of nasal congestion, no sore throat Cardiovascular:  no chest pain Respiratory:  no shortness of breath Gastrointestinal:  no abdominal pain, no change in bowel habits GU:  Male: negative for dysuria Musculoskeletal/Extremities: +internittent neck pain Integumentary (Skin/Breast):  no abnormal skin lesions reported Neurologic:  no headaches Endocrine: No unexpected weight changes Hematologic/Lymphatic:  no night sweats  Exam BP 112/80 (BP Location: Right Arm, Patient Position:  Sitting, Cuff Size: Normal)   Pulse 80   Temp 97.7 F (36.5 C)   Resp 17   Ht 5\' 7"  (1.702 m)   Wt 147 lb (66.7 kg)   SpO2 98%   BMI 23.02 kg/m  General:  well developed, well nourished, in no apparent distress Skin:  no significant moles, warts, or growths Head:  no masses, lesions, or tenderness Eyes:  pupils equal and round, sclera anicteric without injection Ears:  canals without lesions, TMs shiny without retraction, no obvious effusion, no erythema Nose:  nares patent, septum midline, mucosa normal Throat/Pharynx:  lips and gingiva without lesion; tongue and uvula midline; non-inflamed pharynx; no exudates or postnasal drainage Neck: neck supple without adenopathy, thyromegaly, or masses Lungs:  clear to auscultation, breath sounds equal bilaterally, no respiratory distress Cardio:  regular rate and rhythm, no bruits, no LE edema Abdomen:  abdomen soft, nontender; bowel sounds normal; no masses or organomegaly Genital (male): Deferred Rectal: Deferred Musculoskeletal:  symmetrical muscle groups noted without atrophy or deformity Extremities:  no clubbing, cyanosis, or edema, no deformities, no skin discoloration Neuro:  gait normal; deep tendon reflexes normal and symmetric Psych: well oriented with normal range of affect and appropriate judgment/insight  Assessment and Plan  Well adult exam  Encounter for hepatitis C screening test for low risk patient - Plan: Hepatitis C antibody  Other hyperlipidemia - Plan: Lipid panel, Comprehensive metabolic panel   Well 35 y.o. male. Counseled on diet and exercise. Self testicular exams  recommended at least monthly.  Other orders as above. Supplements for IBS written down. Declined pharmacotherapy at this time.  Follow up in 1 year pending the above workup. The patient voiced understanding and agreement to the plan.  Jilda Roche [place], DO 12/03/20 11:41 AM

## 2020-12-03 NOTE — Patient Instructions (Addendum)
Give [place] 2-3 business days to get the results of your labs back.   Keep the diet clean and stay active.  Consider CoQ10 and L Carnitine. You could also consider Melatonin.   Let us know if you need anything.  EXERCISES RANGE OF MOTION (ROM) AND STRETCHING EXERCISES  These exercises may help you when beginning to rehabilitate your issue. In order to successfully resolve your symptoms, you must improve your posture. These exercises are designed to help reduce the forward-head and rounded-shoulder posture which contributes to this condition. Your symptoms may resolve with or without further involvement from your physician, physical therapist or athletic trainer. While completing these exercises, remember:   Restoring tissue flexibility helps normal motion to return to the joints. This allows healthier, less painful movement and activity.  An effective stretch should be held for at least 20 seconds, although you may need to begin with shorter hold times for comfort.  A stretch should never be painful. You should only feel a gentle lengthening or release in the stretched tissue.  Do not do any stretch or exercise that you cannot tolerate.  STRETCH- Axial Extensors  Lie on your back on the floor. You may bend your knees for comfort. Place a rolled-up hand towel or dish towel, about 2 inches in diameter, under the part of your head that makes contact with the floor.  Gently tuck your chin, as if trying to make a "double chin," until you feel a gentle stretch at the base of your head.  Hold 15-20 seconds. Repeat 2-3 times. Complete this exercise 1 time per day.   STRETCH - Axial Extension   Stand or sit on a firm surface. Assume a good posture: chest up, shoulders drawn back, abdominal muscles slightly tense, knees unlocked (if standing) and feet hip width apart.  Slowly retract your chin so your head slides back and your chin slightly lowers. Continue to look straight ahead.  You should  feel a gentle stretch in the back of your head. Be certain not to feel an aggressive stretch since this can cause headaches later.  Hold for 15-20 seconds. Repeat 2-3 times. Complete this exercise 1 time per day.  STRETCH - Cervical Side Bend   Stand or sit on a firm surface. Assume a good posture: chest up, shoulders drawn back, abdominal muscles slightly tense, knees unlocked (if standing) and feet hip width apart.  Without letting your nose or shoulders move, slowly tip your right / left ear to your shoulder until your feel a gentle stretch in the muscles on the opposite side of your neck.  Hold 15-20 seconds. Repeat 2-3 times. Complete this exercise 1-2 times per day.  STRETCH - Cervical Rotators   Stand or sit on a firm surface. Assume a good posture: chest up, shoulders drawn back, abdominal muscles slightly tense, knees unlocked (if standing) and feet hip width apart.  Keeping your eyes level with the ground, slowly turn your head until you feel a gentle stretch along the back and opposite side of your neck.  Hold 15-20 seconds. Repeat 2-3 times. Complete this exercise 1-2 times per day.  RANGE OF MOTION - Neck Circles   Stand or sit on a firm surface. Assume a good posture: chest up, shoulders drawn back, abdominal muscles slightly tense, knees unlocked (if standing) and feet hip width apart.  Gently roll your head down and around from the back of one shoulder to the back of the other. The motion should never be forced  or painful.  Repeat the motion 10-20 times, or until you feel the neck muscles relax and loosen. Repeat 2-3 times. Complete the exercise 1-2 times per day. STRENGTHENING EXERCISES - Cervical Strain and Sprain These exercises may help you when beginning to rehabilitate your injury. They may resolve your symptoms with or without further involvement from your physician, physical therapist, or athletic trainer. While completing these exercises, remember:   Muscles  can gain both the endurance and the strength needed for everyday activities through controlled exercises.  Complete these exercises as instructed by your physician, physical therapist, or athletic trainer. Progress the resistance and repetitions only as guided.  You may experience muscle soreness or fatigue, but the pain or discomfort you are trying to eliminate should never worsen during these exercises. If this pain does worsen, stop and make certain you are following the directions exactly. If the pain is still present after adjustments, discontinue the exercise until you can discuss the trouble with your clinician.  STRENGTH - Cervical Flexors, Isometric  Face a wall, standing about 6 inches away. Place a small pillow, a ball about 6-8 inches in diameter, or a folded towel between your forehead and the wall.  Slightly tuck your chin and gently push your forehead into the soft object. Push only with mild to moderate intensity, building up tension gradually. Keep your jaw and forehead relaxed.  Hold 10 to 20 seconds. Keep your breathing relaxed.  Release the tension slowly. Relax your neck muscles completely before you start the next repetition. Repeat 2-3 times. Complete this exercise 1 time per day.  STRENGTH- Cervical Lateral Flexors, Isometric   Stand about 6 inches away from a wall. Place a small pillow, a ball about 6-8 inches in diameter, or a folded towel between the side of your head and the wall.  Slightly tuck your chin and gently tilt your head into the soft object. Push only with mild to moderate intensity, building up tension gradually. Keep your jaw and forehead relaxed.  Hold 10 to 20 seconds. Keep your breathing relaxed.  Release the tension slowly. Relax your neck muscles completely before you start the next repetition. Repeat 2-3 times. Complete this exercise 1 time per day.  STRENGTH - Cervical Extensors, Isometric   Stand about 6 inches away from a wall. Place a  small pillow, a ball about 6-8 inches in diameter, or a folded towel between the back of your head and the wall.  Slightly tuck your chin and gently tilt your head back into the soft object. Push only with mild to moderate intensity, building up tension gradually. Keep your jaw and forehead relaxed.  Hold 10 to 20 seconds. Keep your breathing relaxed.  Release the tension slowly. Relax your neck muscles completely before you start the next repetition. Repeat 2-3 times. Complete this exercise 1 time per day.  POSTURE AND BODY MECHANICS CONSIDERATIONS Keeping correct posture when sitting, standing or completing your activities will reduce the stress put on different body tissues, allowing injured tissues a chance to heal and limiting painful experiences. The following are general guidelines for improved posture. Your physician or physical therapist will provide you with any instructions specific to your needs. While reading these guidelines, remember:  The exercises prescribed by your provider will help you have the flexibility and strength to maintain correct postures.  The correct posture provides the optimal environment for your joints to work. All of your joints have less wear and tear when properly supported by a spine with  good posture. This means you will experience a healthier, less painful body.  Correct posture must be practiced with all of your activities, especially prolonged sitting and standing. Correct posture is as important when doing repetitive low-stress activities (typing) as it is when doing a single heavy-load activity (lifting).  PROLONGED STANDING WHILE SLIGHTLY LEANING FORWARD When completing a task that requires you to lean forward while standing in one place for a long time, place either foot up on a stationary 2- to 4-inch high object to help maintain the best posture. When both feet are on the ground, the low back tends to lose its slight inward curve. If this curve  flattens (or becomes too large), then the back and your other joints will experience too much stress, fatigue more quickly, and can cause pain.   RESTING POSITIONS Consider which positions are most painful for you when choosing a resting position. If you have pain with flexion-based activities (sitting, bending, stooping, squatting), choose a position that allows you to rest in a less flexed posture. You would want to avoid curling into a fetal position on your side. If your pain worsens with extension-based activities (prolonged standing, working overhead), avoid resting in an extended position such as sleeping on your stomach. Most people will find more comfort when they rest with their spine in a more neutral position, neither too rounded nor too arched. Lying on a non-sagging bed on your side with a pillow between your knees, or on your back with a pillow under your knees will often provide some relief. Keep in mind, being in any one position for a prolonged period of time, no matter how correct your posture, can still lead to stiffness.  WALKING Walk with an upright posture. Your ears, shoulders, and hips should all line up. OFFICE WORK When working at a desk, create an environment that supports good, upright posture. Without extra support, muscles fatigue and lead to excessive strain on joints and other tissues.  CHAIR:  A chair should be able to slide under your desk when your back makes contact with the back of the chair. This allows you to work closely.  The chair's height should allow your eyes to be level with the upper part of your monitor and your hands to be slightly lower than your elbows.  Body position: ? Your feet should make contact with the floor. If this is not possible, use a foot rest. ? Keep your ears over your shoulders. This will reduce stress on your neck and low back.

## 2020-12-04 LAB — HEPATITIS C ANTIBODY
Hepatitis C Ab: NONREACTIVE
SIGNAL TO CUT-OFF: 0.01 (ref <1.00)

## 2021-04-25 NOTE — Telephone Encounter (Signed)
Highly unlikely that he has Crohn's disease-given negative CT, last colonoscopy, inflammatory markers.  His fecal fat was normal.  Fecal elastase was mildly abnormal  Plan: -Lets try pancreatic enzymes with each meals to see if bloating gets better. Pl do Zenpep (Lipase 40K) 1 tablet with each meals for now -Let us know how he is in 2 weeks -If he is really concerned about Crohn's, or if he continues to have problems, would consider repeating colonoscopy.  RG

## 2021-05-01 ENCOUNTER — Other Ambulatory Visit: Payer: Self-pay | Admitting: *Deleted

## 2021-05-01 DIAGNOSIS — R109 Unspecified abdominal pain: Secondary | ICD-10-CM

## 2021-05-01 DIAGNOSIS — K580 Irritable bowel syndrome with diarrhea: Secondary | ICD-10-CM

## 2021-05-01 MED ORDER — NON FORMULARY: 1.0000 | Freq: Every day | Status: DC

## 2021-05-02 ENCOUNTER — Telehealth: Payer: Self-pay | Admitting: Gastroenterology

## 2021-05-02 NOTE — Telephone Encounter (Signed)
Patient called said there was no medication sent to his pharmacy please resend. Thanks

## 2021-05-03 ENCOUNTER — Other Ambulatory Visit: Payer: Self-pay

## 2021-05-03 MED ORDER — ZENPEP 40000-126000 UNITS PO CPEP: 1.0000 | ORAL_CAPSULE | Freq: Three times a day (TID) | ORAL | 1 refills | Status: DC

## 2021-05-03 NOTE — Telephone Encounter (Signed)
Attempted to contact patient, no answer. Left message on voicemail to return call.

## 2021-05-06 NOTE — Telephone Encounter (Signed)
Attempted to contact patient. No answer, left message on voicemail to return call

## 2021-05-07 NOTE — Telephone Encounter (Signed)
Attempted to contact patient, no answer unable to leave message. Will close encounter as this is 3rd attempt.

## 2021-07-10 ENCOUNTER — Ambulatory Visit: Payer: 59 | Admitting: Family Medicine

## 2021-07-17 ENCOUNTER — Other Ambulatory Visit: Payer: Self-pay | Admitting: General Surgery

## 2021-07-17 MED ORDER — ZENPEP 40000-126000 UNITS PO CPEP
1.0000 | ORAL_CAPSULE | Freq: Three times a day (TID) | ORAL | 1 refills | Status: DC
Start: 2021-07-17 — End: 2021-11-07

## 2021-08-30 IMAGING — CT CT ABD-PELV W/ CM
2 of 4 series · 16 of 46 positions shown, 18 images · IV contrast (Omnipaque)
Comparison: None

CLINICAL DATA: Bloating and abdominal discomfort for 4 years

EXAM:
CT ABDOMEN AND PELVIS WITH CONTRAST
TECHNIQUE: Multidetector CT imaging of the abdomen and pelvis was performed
using the standard protocol following bolus administration of
intravenous contrast.
CONTRAST:  100mL OMNIPAQUE IOHEXOL 300 MG/ML  SOLN

[Series 2: axial st · axial · 0.79mm/px · z∈[+574,+989]mm · 13 of 91 slices shown, 15 images]
[im 4/91  soft-tissue]
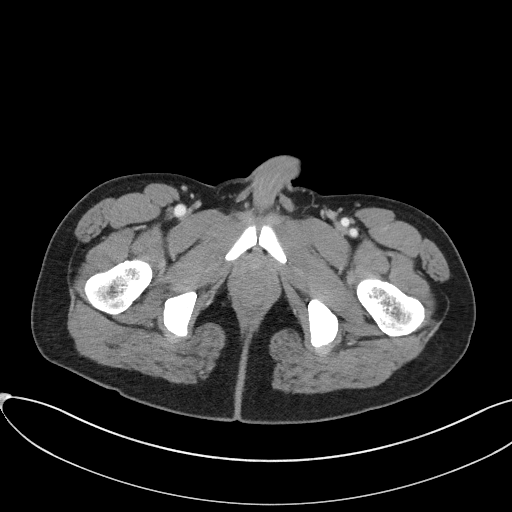
[im 4/91  bone]
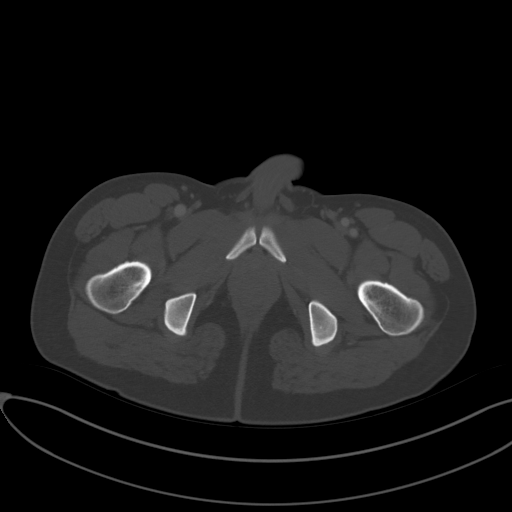
[im 12/91  soft-tissue]
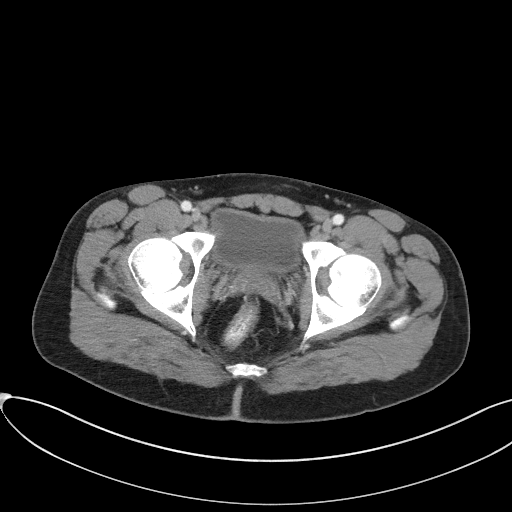
[im 19/91  soft-tissue]
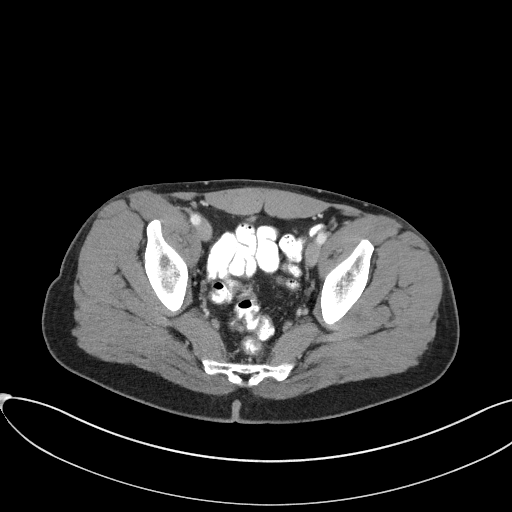
[im 27/91  soft-tissue]
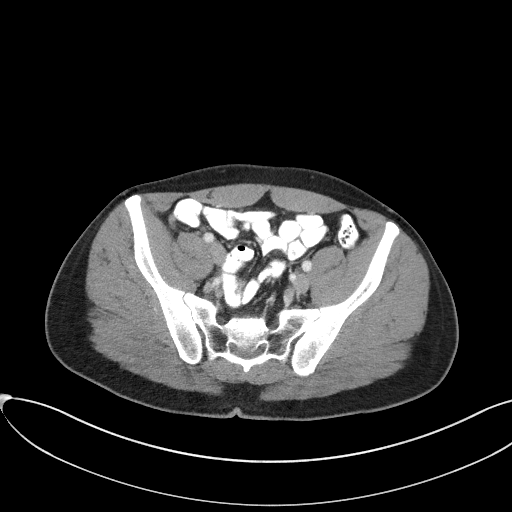
[im 31/91  soft-tissue]
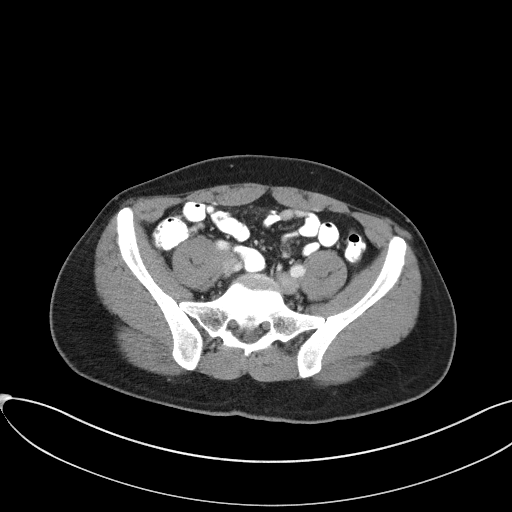
[im 38/91  soft-tissue]
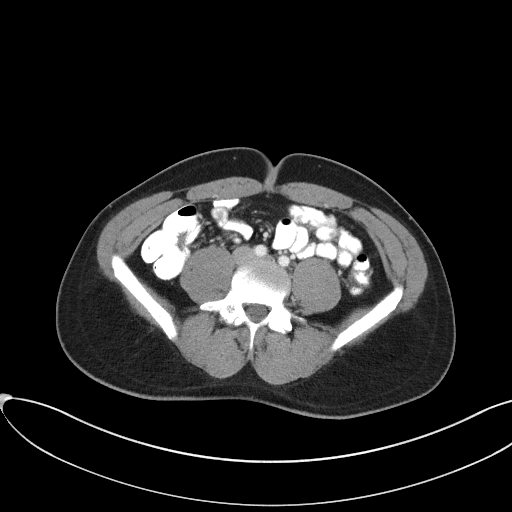
[im 46/91  soft-tissue]
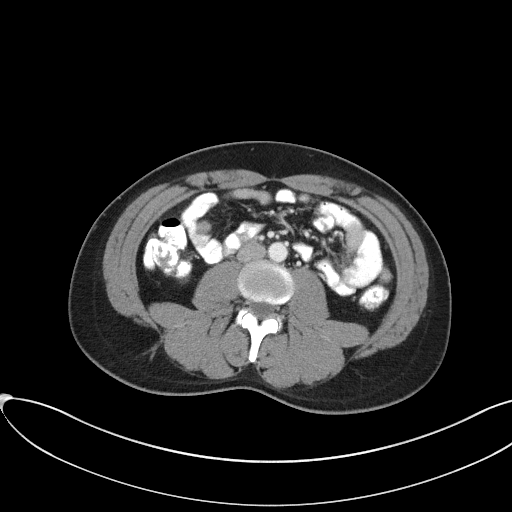
[im 53/91  soft-tissue]
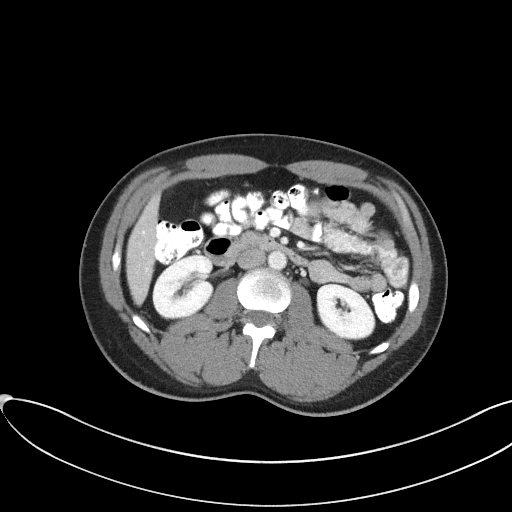
[im 61/91  soft-tissue]
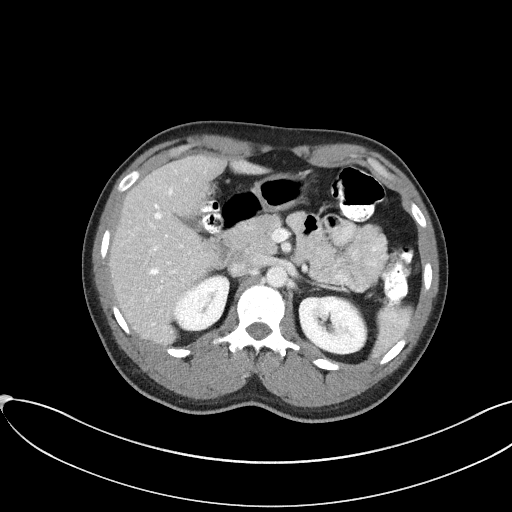
[im 61/91  bone]
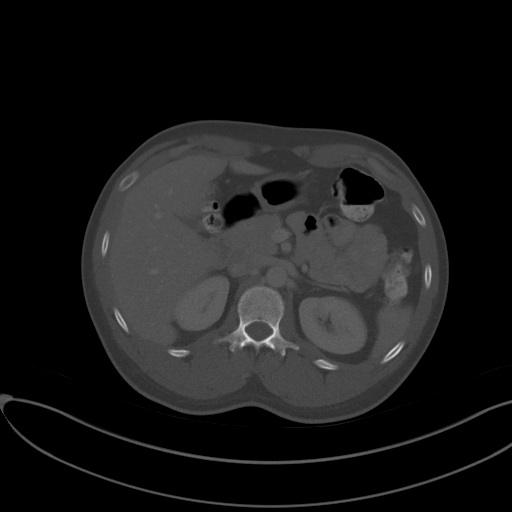
[im 64/91  soft-tissue]
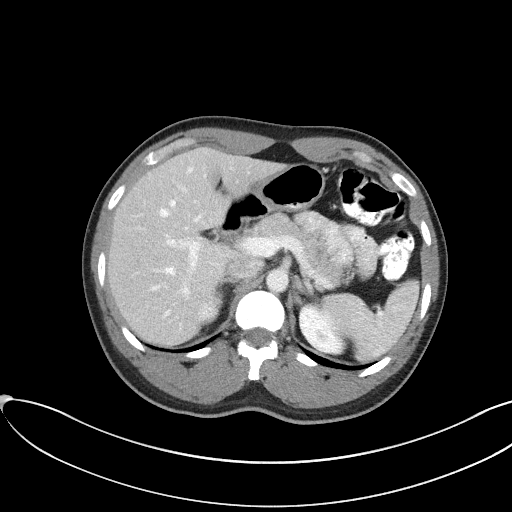
[im 72/91  soft-tissue]
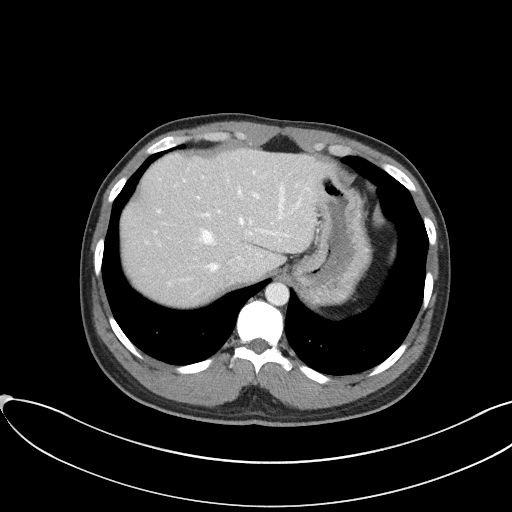
[im 79/91  soft-tissue]
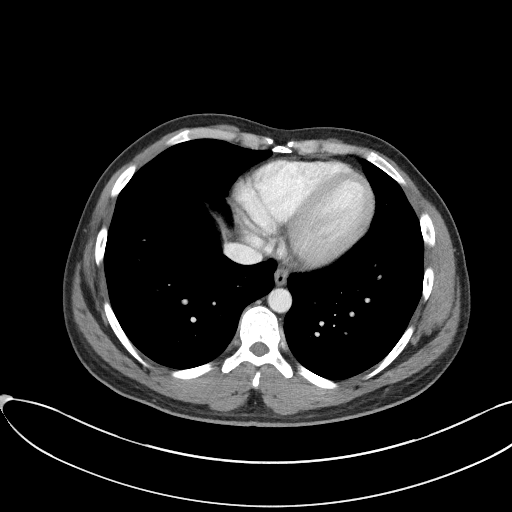
[im 87/91  soft-tissue]
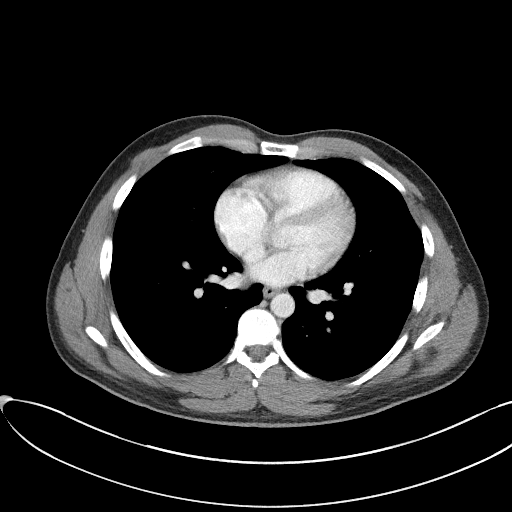

[Series 5: coronal st · coronal · 0.68mm/px · 3 of 88 slices shown]
[im 30/88  soft-tissue]
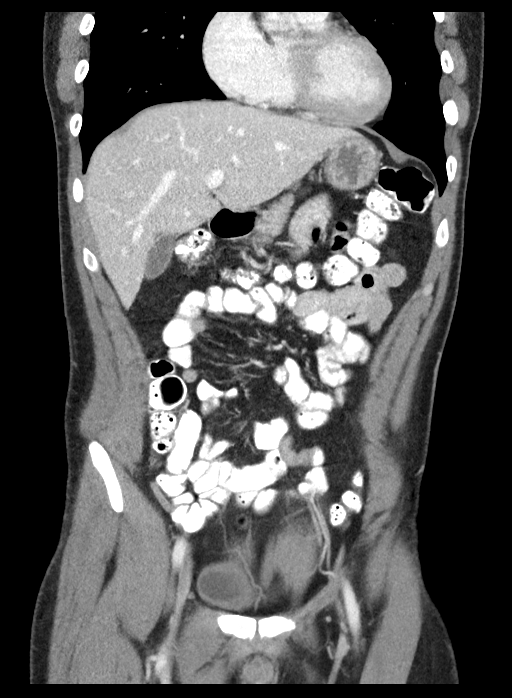
[im 39/88  soft-tissue]
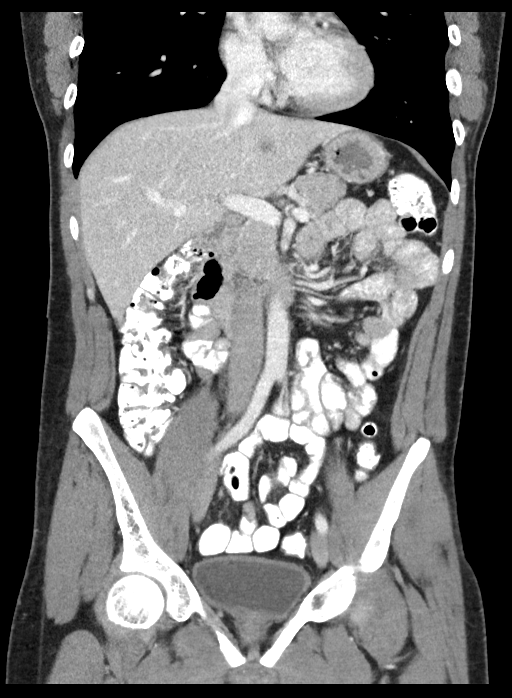
[im 49/88  soft-tissue]
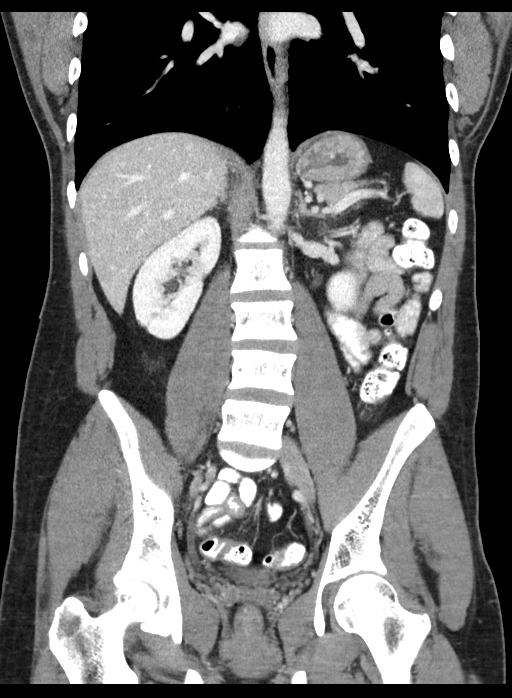

[16 of 46 positions shown; findings below may reference images not displayed]

FINDINGS: Lower chest: No acute abnormality.

Hepatobiliary: No focal liver abnormality is seen. No gallstones,
gallbladder wall thickening, or biliary dilatation.

Pancreas: Unremarkable. No pancreatic ductal dilatation or
surrounding inflammatory changes.

Spleen: Normal in size without focal abnormality.

Adrenals/Urinary Tract: Normal appearance of the adrenal glands. 4
mm low-density structure within posterior cortex of the right mid
kidney is too small to characterize. No enhancing mass or
hydronephrosis identified bilaterally. The urinary bladder is
unremarkable.

Stomach/Bowel: Stomach is within normal limits. Appendix appears
normal. No evidence of bowel wall thickening, distention, or
inflammatory changes.

Vascular/Lymphatic: No significant vascular findings are present. No
enlarged abdominal or pelvic lymph nodes.

Reproductive: Prostate is unremarkable.

Other: No abdominal wall hernia or abnormality. No abdominopelvic
ascites.

Musculoskeletal: No acute or significant osseous findings.
IMPRESSION: No acute findings identified within the abdomen or pelvis.

## 2021-11-04 ENCOUNTER — Telehealth: Payer: Self-pay | Admitting: Gastroenterology

## 2021-11-04 NOTE — Telephone Encounter (Signed)
Inbound call from patient states he stopped the enzyme about a month. Since stopping, he reports have been feeling bloated, constant diarrhea, and stomach feels active all the time. States while taking the medication he was less bloated.

## 2021-11-05 NOTE — Telephone Encounter (Signed)
Pt states that he took the enzyme ( Pancrelipase) Zenpep: for two months. Pt stated that he took it Oct through the end of Dec, stopped for January: Pt stated that he felt better while taking the enzymes.  Pt states that since he stopped  that he has been having bloating, Diarrhea frequently and states that his stomach is always active. Please advise

## 2021-11-07 ENCOUNTER — Other Ambulatory Visit: Payer: Self-pay

## 2021-11-07 DIAGNOSIS — R140 Abdominal distension (gaseous): Secondary | ICD-10-CM

## 2021-11-07 DIAGNOSIS — K580 Irritable bowel syndrome with diarrhea: Secondary | ICD-10-CM

## 2021-11-07 MED ORDER — ZENPEP 40000-126000 UNITS PO CPEP: 1.0000 | ORAL_CAPSULE | Freq: Three times a day (TID) | ORAL | 6 refills | Status: DC

## 2021-11-07 NOTE — Telephone Encounter (Signed)
Please go back on pancreatic enzymes-same as before RG

## 2021-11-07 NOTE — Telephone Encounter (Signed)
Pt made aware of Dr. Chales Abrahams recommendations:  Prescription sent to pharmacy: Pt verbalized understanding with all questions answered.

## 2021-11-17 ENCOUNTER — Other Ambulatory Visit: Payer: Self-pay | Admitting: Family Medicine

## 2022-03-31 ENCOUNTER — Ambulatory Visit (INDEPENDENT_AMBULATORY_CARE_PROVIDER_SITE_OTHER): Payer: 59 | Admitting: Family Medicine

## 2022-03-31 ENCOUNTER — Encounter: Payer: Self-pay | Admitting: Family Medicine

## 2022-03-31 VITALS — BP 128/82 | HR 74 | Temp 98.4°F | Ht 68.0 in | Wt 154.1 lb

## 2022-03-31 DIAGNOSIS — K589 Irritable bowel syndrome without diarrhea: Secondary | ICD-10-CM

## 2022-03-31 DIAGNOSIS — L299 Pruritus, unspecified: Secondary | ICD-10-CM | POA: Diagnosis not present

## 2022-03-31 MED ORDER — NORTRIPTYLINE HCL 25 MG PO CAPS: 25.0000 mg | ORAL_CAPSULE | Freq: Every day | ORAL | 1 refills | Status: DC

## 2022-03-31 MED ORDER — CLOTRIMAZOLE-BETAMETHASONE 1-0.05 % EX CREA: 1.0000 | TOPICAL_CREAM | Freq: Every day | CUTANEOUS | 0 refills | Status: DC

## 2022-03-31 NOTE — Patient Instructions (Addendum)
Schedule a follow up appointment with Dr. Chales Abrahams in the next 6-8 weeks. Please call soon.  Consider IBGard again.   Let us know if you need anything.

## 2022-03-31 NOTE — Progress Notes (Signed)
Chief Complaint  Patient presents with   Abdominal Pain    Feet itching-- right foot     Bradley Tucker is a 36 y.o. male here for a skin complaint.  Duration: 3 mo Location: R foot Pruritic? Yes Painful? No Drainage? No New soaps/lotions/topicals/detergents? No Sick contacts? No Other associated symptoms: no fevers, redness, scaling Therapies tried thus far: terbinafine and clotrimazole  Abd bloating Going on for 6 years, seeing GI. He will bloating in the evening, worse for certain foods. He usually avoids those foods from his diet. He has had multiple imaging/scopes without significant findings. It is affecting his ADL's. No fevers, N/V/D/C. He has failed fiber and Elavil 10 mg qhs. Has tried/failed Gas-X, IBGard, and Beano.  Past Medical History:  Diagnosis Date   Chronic idiopathic urticaria    GERD (gastroesophageal reflux disease)    IBS (irritable bowel syndrome)     BP 128/82   Pulse 74   Temp 98.4 F (36.9 C) (Oral)   Ht 5\' 8"  (1.727 m)   Wt 154 lb 2 oz (69.9 kg)   SpO2 96%   BMI 23.43 kg/m  Gen: awake, alert, appearing stated age Heart: RRR Abdomen: Bowel sounds present, soft, diffusely TTP, mildly distended Lungs: CTAB. No accessory muscle use Skin: callous formation over arch of R foot without scaling, drainage, erythema, TTP, fluctuance, excoriation Psych: Age appropriate judgment and insight  Irritable bowel syndrome, unspecified type - Plan: nortriptyline (PAMELOR) 25 MG capsule  Pruritus - Plan: clotrimazole-betamethasone (LOTRISONE) cream  Chronic, uncontrolled.  He has failed concentrated peppermint, amitriptyline 10 mg daily, Beano, Gas-X, PPIs, SIBO treatment, and dietary modifications.  Trial Pamelor 25 mg daily.  Recommend follow-up with regular GI in 6 to 8 weeks.  Could consider higher doses of amitriptyline versus Effexor. He has chronic pruritic changes at the bottom of his foot with hyperkeratinization.  Does not look like athlete's foot  but will cover for that with Lotrisone given historical success with that treatment.  Try not to scratch and avoid scented products. F/u as originally scheduled. The patient voiced understanding and agreement to the plan.  [place], DO 03/31/22 4:17 PM

## 2022-04-10 ENCOUNTER — Ambulatory Visit (INDEPENDENT_AMBULATORY_CARE_PROVIDER_SITE_OTHER): Payer: 59 | Admitting: Family Medicine

## 2022-04-10 ENCOUNTER — Encounter: Payer: Self-pay | Admitting: Family Medicine

## 2022-04-10 VITALS — BP 124/80 | HR 90 | Temp 98.0°F | Resp 16 | Ht 68.0 in | Wt 154.0 lb

## 2022-04-10 DIAGNOSIS — Z0000 Encounter for general adult medical examination without abnormal findings: Secondary | ICD-10-CM

## 2022-04-10 MED ORDER — FLUTICASONE PROPIONATE 50 MCG/ACT NA SUSP: 2.0000 | Freq: Every day | NASAL | 6 refills | Status: DC

## 2022-04-10 NOTE — Progress Notes (Signed)
Chief Complaint  Patient presents with   Annual Exam    Annual Exam     Well Male Bradley Tucker is here for a complete physical.   His last physical was >1 year ago.  Current diet: in general, a "healthy" diet.   Current exercise: lifting wts, walking, HIIT Weight trend: stable Fatigue out of ordinary? No. Seat belt? Yes.   Advanced directive? No  Health maintenance Tetanus- Yes HIV- Yes Hep C- Yes  Past Medical History:  Diagnosis Date   Chronic idiopathic urticaria    GERD (gastroesophageal reflux disease)    IBS (irritable bowel syndrome)      Past Surgical History:  Procedure Laterality Date   [place]ON[place]COPY  09/2017   [place]   ESOPHAGOGASTRODUODENOSCOPY  06/24/2017   Mild gastritis. Otherwise normal EGD    Medications  Current Outpatient Medications on File Prior to Visit  Medication Sig Dispense Refill   clotrimazole-betamethasone (LOTRISONE) cream Apply 1 Application topically daily. 30 g 0   nortriptyline (PAMELOR) 25 MG capsule Take 1 capsule (25 mg total) by mouth at bedtime. 30 capsule 1   Omega-3 Fatty Acids (FISH OIL PO) Take 1 tablet by mouth daily.     Pancrelipase, Lip-Prot-Amyl, (ZENPEP) 40000-126000 units CPEP Take 1 tablet by mouth 3 (three) times daily. Take with food 90 capsule 6   No current facility-administered medications on file prior to visit.     Allergies No Known Allergies  Family History Family History  Problem Relation Age of Onset   Asthma Mother    Colon cancer Neg Hx    Esophageal cancer Neg Hx     Review of Systems: Constitutional: no fevers or chills Eye:  no recent significant change in vision Ear/Nose/Mouth/Throat:  Ears:  no hearing loss Nose/Mouth/Throat:  no complaints of nasal congestion, no sore throat Cardiovascular:  no chest pain Respiratory:  no shortness of breath Gastrointestinal:  no abdominal pain, no change in bowel habits GU:  Male: negative for dysuria Musculoskeletal/Extremities:  no pain of  the joints Integumentary (Skin/Breast):  no abnormal skin lesions reported Neurologic:  no headaches Endocrine: No unexpected weight changes Hematologic/Lymphatic:  no night sweats  Exam BP 124/80 (BP Location: Right Arm, Patient Position: Sitting, Cuff Size: Normal)   Pulse 90   Temp 98 F (36.7 C) (Oral)   Resp 16   Ht 5\' 8"  (1.727 m)   Wt 154 lb (69.9 kg)   SpO2 98%   BMI 23.42 kg/m  General:  well developed, well nourished, in no apparent distress Skin:  no significant moles, warts, or growths Head:  no masses, lesions, or tenderness Eyes:  pupils equal and round, sclera anicteric without injection Ears:  canals without lesions, TMs shiny without retraction, no obvious effusion, no erythema Nose:  nares patent, septum midline, mucosa normal Throat/Pharynx:  lips and gingiva without lesion; tongue and uvula midline; non-inflamed pharynx; no exudates or postnasal drainage Neck: neck supple without adenopathy, thyromegaly, or masses Lungs:  clear to auscultation, breath sounds equal bilaterally, no respiratory distress Cardio:  regular rate and rhythm, no bruits, no LE edema Abdomen:  abdomen soft, nontender; bowel sounds normal; no masses or organomegaly Genital (male): Deferred Rectal: Deferred Musculoskeletal:  symmetrical muscle groups noted without atrophy or deformity Extremities:  no clubbing, cyanosis, or edema, no deformities, no skin discoloration Neuro:  gait normal; deep tendon reflexes normal and symmetric Psych: well oriented with normal range of affect and appropriate judgment/insight  Assessment and Plan  Well adult exam -  Plan: CBC, Comprehensive metabolic panel, Lipid panel   Well 36 y.o. male. Counseled on diet and exercise. Advanced directive form provided today.  Self testicular exams recommended at least monthly.  Other orders as above. Follow up in 1 year pending the above workup. The patient voiced understanding and agreement to the  plan.  Jilda Roche [place], DO 04/10/22 1:51 PM

## 2022-04-10 NOTE — Patient Instructions (Signed)
Give us 2-3 business days to get the results of your labs back.   Keep the diet clean and stay active.  Please get me a copy of your advanced directive form at your convenience.   Do monthly self testicular checks in the shower. You are feeling for lumps/bumps that don't belong. If you feel anything like this, let me know!  Let us know if you need anything.  

## 2022-04-11 LAB — COMPREHENSIVE METABOLIC PANEL
ALT: 28 U/L (ref 0–53)
AST: 18 U/L (ref 0–37)
Albumin: 4.7 g/dL (ref 3.5–5.2)
Alkaline Phosphatase: 37 U/L — ABNORMAL LOW (ref 39–117)
BUN: 24 mg/dL — ABNORMAL HIGH (ref 6–23)
CO2: 29 mEq/L (ref 19–32)
Calcium: 9.6 mg/dL (ref 8.4–10.5)
Chloride: 102 mEq/L (ref 96–112)
Creatinine, Ser: 1.07 mg/dL (ref 0.40–1.50)
GFR: 88.6 mL/min (ref >60.00)
Glucose, Bld: 80 mg/dL (ref 70–99)
Potassium: 4.3 mEq/L (ref 3.5–5.1)
Sodium: 141 mEq/L (ref 135–145)
Total Bilirubin: 0.3 mg/dL (ref 0.2–1.2)
Total Protein: 6.9 g/dL (ref 6.0–8.3)

## 2022-04-11 LAB — CBC
HCT: 43.7 % (ref 39.0–52.0)
Hemoglobin: 14.7 g/dL (ref 13.0–17.0)
MCHC: 33.7 g/dL (ref 30.0–36.0)
MCV: 85.2 fl (ref 78.0–100.0)
Platelets: 207 10*3/uL (ref 150.0–400.0)
RBC: 5.12 Mil/uL (ref 4.22–5.81)
RDW: 13 % (ref 11.5–15.5)
WBC: 4.1 10*3/uL (ref 4.0–10.5)

## 2022-04-11 LAB — LIPID PANEL
Cholesterol: 226 mg/dL — ABNORMAL HIGH (ref 0–200)
HDL: 47.4 mg/dL (ref >39.00)
LDL Cholesterol: 142 mg/dL — ABNORMAL HIGH (ref 0–99)
NonHDL: 178.5
Total CHOL/HDL Ratio: 5
Triglycerides: 183 mg/dL — ABNORMAL HIGH (ref 0.0–149.0)
VLDL: 36.6 mg/dL (ref 0.0–40.0)

## 2022-07-20 ENCOUNTER — Encounter: Payer: Self-pay | Admitting: Family Medicine

## 2022-07-21 ENCOUNTER — Other Ambulatory Visit: Payer: Self-pay | Admitting: Family Medicine

## 2022-07-21 MED ORDER — KETOCONAZOLE 2 % EX CREA: 1.0000 | TOPICAL_CREAM | Freq: Every day | CUTANEOUS | 0 refills | Status: DC

## 2022-09-10 ENCOUNTER — Other Ambulatory Visit: Payer: Self-pay | Admitting: Family Medicine

## 2022-09-10 NOTE — Telephone Encounter (Signed)
Not on list 

## 2022-10-18 ENCOUNTER — Ambulatory Visit
Admission: EM | Admit: 2022-10-18 | Discharge: 2022-10-18 | Disposition: A | Discharge status: Home / Self Care | Payer: 59 | Attending: Nurse Practitioner | Admitting: Nurse Practitioner

## 2022-10-18 ENCOUNTER — Encounter: Payer: Self-pay | Admitting: Emergency Medicine

## 2022-10-18 DIAGNOSIS — U071 COVID-19: Secondary | ICD-10-CM | POA: Diagnosis not present

## 2022-10-18 DIAGNOSIS — R051 Acute cough: Secondary | ICD-10-CM | POA: Diagnosis present

## 2022-10-18 DIAGNOSIS — J069 Acute upper respiratory infection, unspecified: Secondary | ICD-10-CM

## 2022-10-18 NOTE — Discharge Instructions (Signed)
Your symptoms and exam are consistent for a viral illness. Please treat your symptoms with over the counter cough medication, tylenol or ibuprofen, humidifier, and rest. Viral illnesses can last 7-14 days. Please follow up with your PCP if your symptoms are not improving. Please go to the ER for any worsening symptoms. This includes but is not limited to fever you can not control with tylenol or ibuprofen, you are not able to stay hydrated, you have shortness of breath or chest pain.  Thank you for choosing [facility] for your healthcare needs. I hope you feel better soon!  

## 2022-10-18 NOTE — ED Triage Notes (Signed)
Patient c/o fever last night, body aches and chills, denies sore throat and cough.  Patient has taken Nyquil.

## 2022-10-18 NOTE — ED Provider Notes (Signed)
[facility]    CSN: [id number] Arrival date & time: 10/18/22  1111      History   Chief Complaint Chief Complaint  Patient presents with   Fever    HPI Bradley Tucker is a 37 y.o. male who presents for evaluation of URI symptoms for 1 days. Patient reports associated symptoms of subjective fevers, chills, body aches, cough. Denies N/V/D, sore throat, congestion, ear pain, shortness of breath. Patient does not have a hx of asthma or smoking. No known sick contacts and no recent travel. Pt is vaccinated for COVID. Pt is vaccinated for flu this season. Pt has taken DayQuil/NyQuil OTC for symptoms. Pt has no other concerns at this time.    Fever Associated symptoms: chills, cough and myalgias     Past Medical History:  Diagnosis Date   Chronic idiopathic urticaria    GERD (gastroesophageal reflux disease)    IBS (irritable bowel syndrome)     There are no problems to display for this patient.   Past Surgical History:  Procedure Laterality Date   COLONOSCOPY  09/2017   [place]   ESOPHAGOGASTRODUODENOSCOPY  06/24/2017   Mild gastritis. Otherwise normal EGD       Home Medications    Prior to Admission medications   Medication Sig Start Date End Date Taking? Authorizing Provider  fluticasone (FLONASE) 50 MCG/ACT nasal spray Place 2 sprays into both nostrils daily. 04/10/22  Yes Wendling, Crosby Oyster, DO  levocetirizine (XYZAL) 5 MG tablet TAKE ONE TABLET BY MOUTH TWICE A DAY 09/10/22  Yes Shelda Pal, DO  nortriptyline (PAMELOR) 25 MG capsule Take 1 capsule (25 mg total) by mouth at bedtime. 03/31/22  Yes Wendling, Crosby Oyster, DO  Omega-3 Fatty Acids (FISH OIL PO) Take 1 tablet by mouth daily.   Yes [provider]  Pancrelipase, Lip-Prot-Amyl, (ZENPEP) 40000-126000 units CPEP Take 1 tablet by mouth 3 (three) times daily. Take with food 11/07/21  Yes Jackquline Denmark, MD    Family History Family History  Problem Relation Age of Onset    Asthma Mother    Colon cancer Neg Hx    Esophageal cancer Neg Hx     Social History Social History   Tobacco Use   Smoking status: Never   Smokeless tobacco: Never  Vaping Use   Vaping Use: Never used  Substance Use Topics   Alcohol use: Yes    Comment: ocassionally    Drug use: Never     Allergies   Patient has no known allergies.   Review of Systems Review of Systems  Constitutional:  Positive for chills and fever.  Respiratory:  Positive for cough.   Musculoskeletal:  Positive for myalgias.     Physical Exam Triage Vital Signs ED Triage Vitals  Enc Vitals Group     BP 10/18/22 1124 130/86     Pulse Rate 10/18/22 1124 98     Resp 10/18/22 1124 18     Temp 10/18/22 1124 98.6 F (37 C)     Temp Source 10/18/22 1124 Oral     SpO2 10/18/22 1124 98 %     Weight 10/18/22 1126 153 lb (69.4 kg)     Height 10/18/22 1126 5\' 8"  (1.727 m)     Head Circumference --      Peak Flow --      Pain Score 10/18/22 1125 5     Pain Loc --      Pain Edu? --      Excl.  in GC? --    No data found.  Updated Vital Signs BP 130/86 (BP Location: Left Arm)   Pulse 98   Temp 98.6 F (37 C) (Oral)   Resp 18   Ht 5\' 8"  (1.727 m)   Wt 153 lb (69.4 kg)   SpO2 98%   BMI 23.26 kg/m   Visual Acuity Right Eye Distance:   Left Eye Distance:   Bilateral Distance:    Right Eye Near:   Left Eye Near:    Bilateral Near:     Physical Exam Vitals and nursing note reviewed.  Constitutional:      General: He is not in acute distress.    Appearance: Normal appearance. He is not ill-appearing or toxic-appearing.  HENT:     Head: Normocephalic and atraumatic.     Right Ear: Tympanic membrane and ear canal normal.     Left Ear: Tympanic membrane and ear canal normal.     Nose: No congestion or rhinorrhea.     Mouth/Throat:     Mouth: Mucous membranes are moist.     Pharynx: No oropharyngeal exudate or posterior oropharyngeal erythema.  Eyes:     Pupils: Pupils are equal,  round, and reactive to light.  Cardiovascular:     Rate and Rhythm: Normal rate and regular rhythm.     Heart sounds: Normal heart sounds.  Pulmonary:     Effort: Pulmonary effort is normal.     Breath sounds: Normal breath sounds.  Musculoskeletal:     Cervical back: Normal range of motion and neck supple.  Lymphadenopathy:     Cervical: No cervical adenopathy.  Skin:    General: Skin is warm and dry.  Neurological:     General: No focal deficit present.     Mental Status: He is alert and oriented to person, place, and time.  Psychiatric:        Mood and Affect: Mood normal.        Behavior: Behavior normal.      UC Treatments / Results  Labs (all labs ordered are listed, but only abnormal results are displayed) Labs Reviewed  SARS CORONAVIRUS 2 (TAT 6-24 HRS)    EKG   Radiology No results found.  Procedures Procedures (including critical care time)  Medications Ordered in UC Medications - No data to display  Initial Impression / Assessment and Plan / UC Course  I have reviewed the triage vital signs and the nursing notes.  Pertinent labs & imaging results that were available during my care of the patient were reviewed by me and considered in my medical decision making (see chart for details).     Discussed with patient viral illness and symptomatic treatment  COVID PCR Patient continue over-the-counter cough medicine as needed Rest and fluids Follow up with PCP in 2-3 days for re-check  ER precautions reviewed and pt verbalized understanding  Final Clinical Impressions(s) / UC Diagnoses   Final diagnoses:  Acute cough  Viral upper respiratory tract infection     Discharge Instructions      Your symptoms and exam are consistent for a viral illness. Please treat your symptoms with over the counter cough medication, tylenol or ibuprofen, humidifier, and rest. Viral illnesses can last 7-14 days. Please follow up with your PCP if your symptoms are not  improving. Please go to the ER for any worsening symptoms. This includes but is not limited to fever you can not control with tylenol or ibuprofen, you are not able  to stay hydrated, you have shortness of breath or chest pain.  Thank you for choosing [facility] for your healthcare needs. I hope you feel better soon!    ED Prescriptions   None    PDMP not reviewed this encounter.   Melynda Ripple, NP 10/18/22 1144

## 2022-10-20 LAB — SARS CORONAVIRUS 2 (TAT 6-24 HRS): SARS Coronavirus 2: POSITIVE — AB

## 2022-10-24 ENCOUNTER — Encounter: Payer: Self-pay | Admitting: Family Medicine

## 2023-04-14 ENCOUNTER — Ambulatory Visit (INDEPENDENT_AMBULATORY_CARE_PROVIDER_SITE_OTHER): Payer: 59 | Admitting: Family Medicine

## 2023-04-14 ENCOUNTER — Encounter: Payer: Self-pay | Admitting: Family Medicine

## 2023-04-14 VITALS — BP 108/70 | HR 76 | Temp 97.4°F | Ht 68.0 in | Wt 163.0 lb

## 2023-04-14 DIAGNOSIS — K589 Irritable bowel syndrome without diarrhea: Secondary | ICD-10-CM | POA: Diagnosis not present

## 2023-04-14 DIAGNOSIS — Z0000 Encounter for general adult medical examination without abnormal findings: Secondary | ICD-10-CM | POA: Diagnosis not present

## 2023-04-14 MED ORDER — AMITRIPTYLINE HCL 25 MG PO TABS: 25.0000 mg | ORAL_TABLET | Freq: Every day | ORAL | 2 refills | Status: DC

## 2023-04-14 MED ORDER — KETOCONAZOLE 2 % EX CREA: 1.0000 | TOPICAL_CREAM | Freq: Every day | CUTANEOUS | 0 refills | Status: DC

## 2023-04-14 NOTE — Patient Instructions (Addendum)
Give [place] 2-3 business days to get the results of your labs back.   Keep the diet clean and stay active.  Do monthly self testicular checks in the shower. You are feeling for lumps/bumps that don't belong. If you feel anything like this, let me know!  Please get me a copy of your advanced directive form at your convenience.   Try to drink 55-60 oz of water daily outside of exercise.  Send me a message in 5-6 weeks with how you are doing on the new medicine. If no better, we will refer you to the GI team with [facility].   Let us know if you need anything.

## 2023-04-14 NOTE — Progress Notes (Signed)
Chief Complaint  Patient presents with   Annual Exam    Well Male Bradley Tucker is here for a complete physical.   His last physical was >1 year ago.  Current diet: in general, a "healthy" diet.   Current exercise: wt lifting, cardio Weight trend: up a few lbs Fatigue out of ordinary? No. Seat belt? Yes.   Advanced directive? No  Health maintenance Tetanus- Yes HIV- Yes Hep C- Yes  IBS The patient was diagnosed with IBS several years ago.  He was started on amitriptyline which he took for couple months.  He did not feel it helped but when he found out it was an antidepressant, he stopped taking it.  He will have intermittent symptoms and diarrhea.  Currently he does have lower abdominal pain.  No bleeding, unintentional weight loss, nighttime awakenings, or fevers.  Past Medical History:  Diagnosis Date   Chronic idiopathic urticaria    GERD (gastroesophageal reflux disease)    IBS (irritable bowel syndrome)      Past Surgical History:  Procedure Laterality Date   [place]  09/2017   [place]   ESOPHAGOGASTRODUODENOSCOPY  06/24/2017   Mild gastritis. Otherwise normal EGD    Medications  Current Outpatient Medications on File Prior to Visit  Medication Sig Dispense Refill   levocetirizine (XYZAL) 5 MG tablet TAKE ONE TABLET BY MOUTH TWICE A DAY 180 tablet 2   Allergies No Known Allergies  Family History Family History  Problem Relation Age of Onset   Asthma Mother    Colon cancer Neg Hx    Esophageal cancer Neg Hx     Review of Systems: Constitutional: no fevers or chills Eye:  no recent significant change in vision Ear/Nose/Mouth/Throat:  Ears:  no hearing loss Nose/Mouth/Throat:  no complaints of nasal congestion, no sore throat Cardiovascular:  no chest pain Respiratory:  no shortness of breath Gastrointestinal: As noted in HPI GU:  Male: negative for dysuria Musculoskeletal/Extremities:  no pain of the joints Integumentary (Skin/Breast):  no  abnormal skin lesions reported Neurologic:  no headaches Endocrine: No unexpected weight changes Hematologic/Lymphatic:  no night sweats  Exam BP 108/70 (BP Location: Left Arm, Patient Position: Sitting, Cuff Size: Normal)   Pulse 76   Temp (!) 97.4 F (36.3 C) (Oral)   Ht 5\' 8"  (1.727 m)   Wt 163 lb (73.9 kg)   SpO2 97%   BMI 24.78 kg/m  General:  well developed, well nourished, in no apparent distress Skin:  no significant moles, warts, or growths Head:  no masses, lesions, or tenderness Eyes:  pupils equal and round, sclera anicteric without injection Ears:  canals without lesions, TMs shiny without retraction, no obvious effusion, no erythema Nose:  nares patent, mucosa normal Throat/Pharynx:  lips and gingiva without lesion; tongue and uvula midline; non-inflamed pharynx; no exudates or postnasal drainage Neck: neck supple without adenopathy, thyromegaly, or masses Lungs:  clear to auscultation, breath sounds equal bilaterally, no respiratory distress Cardio:  regular rate and rhythm, no bruits, no LE edema Abdomen:  abdomen soft, mild TTP in lower quadrants, worse on the left side; bowel sounds normal; no masses or organomegaly Genital (male): Deferred Rectal: Deferred Musculoskeletal:  symmetrical muscle groups noted without atrophy or deformity Extremities:  no clubbing, cyanosis, or edema, no deformities, no skin discoloration Neuro:  gait normal; deep tendon reflexes normal and symmetric Psych: well oriented with normal range of affect and appropriate judgment/insight  Assessment and Plan  Well adult exam - Plan: CBC, Comprehensive  metabolic panel, Lipid panel  Irritable bowel syndrome, unspecified type - Plan: amitriptyline (ELAVIL) 25 MG tablet   Well 37 y.o. male. Counseled on diet and exercise. Self testicular exams recommended at least monthly.  Advanced directive form provided today.  IBS: Chronic, uncontrolled.  Start Elavil 12.5 mg daily for 2 weeks and  then increase to 25 mg daily.  He will send me a message if no improvement the next 5 to 6 weeks and we will refer him to the [facility] gastroenterology team for second opinion.  If he does well, we will keep him on it. Other orders as above. Follow up in 1 year pending the above workup. The patient voiced understanding and agreement to the plan.  Jilda Roche [place], DO 04/14/23 3:21 PM

## 2023-04-15 LAB — CBC
HCT: 44.9 % (ref 39.0–52.0)
Hemoglobin: 14.5 g/dL (ref 13.0–17.0)
MCHC: 32.4 g/dL (ref 30.0–36.0)
MCV: 84.3 fl (ref 78.0–100.0)
Platelets: 221 10*3/uL (ref 150.0–400.0)
RBC: 5.33 Mil/uL (ref 4.22–5.81)
RDW: 13.6 % (ref 11.5–15.5)
WBC: 4.2 10*3/uL (ref 4.0–10.5)

## 2023-04-15 LAB — COMPREHENSIVE METABOLIC PANEL
ALT: 31 U/L (ref 0–53)
AST: 18 U/L (ref 0–37)
Albumin: 4.6 g/dL (ref 3.5–5.2)
Alkaline Phosphatase: 37 U/L — ABNORMAL LOW (ref 39–117)
BUN: 23 mg/dL (ref 6–23)
CO2: 28 mEq/L (ref 19–32)
Calcium: 9.7 mg/dL (ref 8.4–10.5)
Chloride: 103 mEq/L (ref 96–112)
Creatinine, Ser: 1.3 mg/dL (ref 0.40–1.50)
GFR: 69.64 mL/min (ref >60.00)
Glucose, Bld: 83 mg/dL (ref 70–99)
Potassium: 4.6 mEq/L (ref 3.5–5.1)
Sodium: 140 mEq/L (ref 135–145)
Total Bilirubin: 0.3 mg/dL (ref 0.2–1.2)
Total Protein: 7 g/dL (ref 6.0–8.3)

## 2023-04-15 LAB — LIPID PANEL
Cholesterol: 267 mg/dL — ABNORMAL HIGH (ref 0–200)
HDL: 47.2 mg/dL (ref >39.00)
LDL Cholesterol: 184 mg/dL — ABNORMAL HIGH (ref 0–99)
NonHDL: 219.45
Total CHOL/HDL Ratio: 6
Triglycerides: 178 mg/dL — ABNORMAL HIGH (ref 0.0–149.0)
VLDL: 35.6 mg/dL (ref 0.0–40.0)

## 2023-04-16 ENCOUNTER — Other Ambulatory Visit: Payer: Self-pay

## 2023-04-16 DIAGNOSIS — E7800 Pure hypercholesterolemia, unspecified: Secondary | ICD-10-CM

## 2023-05-28 ENCOUNTER — Other Ambulatory Visit (INDEPENDENT_AMBULATORY_CARE_PROVIDER_SITE_OTHER): Payer: 59

## 2023-05-28 ENCOUNTER — Other Ambulatory Visit: Payer: 59

## 2023-05-28 ENCOUNTER — Encounter: Payer: Self-pay | Admitting: Family Medicine

## 2023-05-28 DIAGNOSIS — E7800 Pure hypercholesterolemia, unspecified: Secondary | ICD-10-CM | POA: Diagnosis not present

## 2023-05-28 LAB — LIPID PANEL
Cholesterol: 236 mg/dL — ABNORMAL HIGH (ref 0–200)
HDL: 49 mg/dL (ref >39.00)
LDL Cholesterol: 165 mg/dL — ABNORMAL HIGH (ref 0–99)
NonHDL: 187.03
Total CHOL/HDL Ratio: 5
Triglycerides: 109 mg/dL (ref 0.0–149.0)
VLDL: 21.8 mg/dL (ref 0.0–40.0)

## 2023-05-29 ENCOUNTER — Other Ambulatory Visit: Payer: Self-pay | Admitting: Family Medicine

## 2023-05-29 DIAGNOSIS — E785 Hyperlipidemia, unspecified: Secondary | ICD-10-CM

## 2023-06-02 ENCOUNTER — Other Ambulatory Visit: Payer: Self-pay | Admitting: Family Medicine

## 2023-11-20 ENCOUNTER — Other Ambulatory Visit: Payer: Self-pay | Admitting: Family Medicine

## 2023-11-20 NOTE — Telephone Encounter (Signed)
 Copied from CRM [id number]. Topic: Clinical - Medication Refill >> Nov 20, 2023  5:00 PM Denese Killings wrote: Most Recent Primary Care Visit:  Provider: [facility]  Department[facility]  Visit Type: LAB  Date: 05/28/2023  Medication: levocetirizine (XYZAL) 5 MG tablet  Has the patient contacted their pharmacy? No (Agent: If no, request that the patient contact the pharmacy for the refill. If patient does not wish to contact the pharmacy document the reason why and proceed with request.) (Agent: If yes, when and what did the pharmacy advise?) never had a refill with pharmacy (new pharmacy)  Is this the correct pharmacy for this prescription? Yes If no, delete pharmacy and type the correct one.  This is the patient's preferred pharmacy:  [id number] [place] - [address] [place] [address] Phone: [phone] Fax: [phone]   Has the prescription been filled recently? No  Is the patient out of the medication? Yes  Has the patient been seen for an appointment in the last year OR does the patient have an upcoming appointment? Yes  Can we respond through MyChart? Yes  Agent: Please be advised that Rx refills may take up to 3 business days. We ask that you follow-up with your pharmacy.

## 2023-11-20 NOTE — Telephone Encounter (Signed)
 Last Fill: 09/10/22  Last OV: 04/14/23 Next OV: None Scheduled  Routing to provider for review/authorization.

## 2023-11-23 MED ORDER — LEVOCETIRIZINE DIHYDROCHLORIDE 5 MG PO TABS: 5.0000 mg | ORAL_TABLET | Freq: Two times a day (BID) | ORAL | 0 refills | Status: DC

## 2023-11-24 ENCOUNTER — Encounter: Payer: Self-pay | Admitting: Family Medicine

## 2023-11-24 ENCOUNTER — Ambulatory Visit: Payer: 59 | Admitting: Family Medicine

## 2023-11-24 ENCOUNTER — Other Ambulatory Visit (INDEPENDENT_AMBULATORY_CARE_PROVIDER_SITE_OTHER): Payer: 59

## 2023-11-24 DIAGNOSIS — E785 Hyperlipidemia, unspecified: Secondary | ICD-10-CM | POA: Diagnosis not present

## 2023-11-24 LAB — LIPID PANEL
Cholesterol: 211 mg/dL — ABNORMAL HIGH (ref 0–200)
HDL: 52.7 mg/dL (ref >39.00)
LDL Cholesterol: 133 mg/dL — ABNORMAL HIGH (ref 0–99)
NonHDL: 158.67
Total CHOL/HDL Ratio: 4
Triglycerides: 126 mg/dL (ref 0.0–149.0)
VLDL: 25.2 mg/dL (ref 0.0–40.0)

## 2024-02-10 ENCOUNTER — Other Ambulatory Visit: Payer: Self-pay | Admitting: Family Medicine

## 2024-04-27 ENCOUNTER — Ambulatory Visit: Admitting: Gastroenterology

## 2024-04-27 NOTE — Progress Notes (Deleted)
 Bradley Tucker

## 2024-05-09 ENCOUNTER — Other Ambulatory Visit: Payer: Self-pay | Admitting: Family Medicine

## 2024-05-18 ENCOUNTER — Ambulatory Visit (INDEPENDENT_AMBULATORY_CARE_PROVIDER_SITE_OTHER): Admitting: Family Medicine

## 2024-05-18 ENCOUNTER — Ambulatory Visit: Payer: Self-pay | Admitting: Family Medicine

## 2024-05-18 ENCOUNTER — Encounter: Payer: Self-pay | Admitting: Family Medicine

## 2024-05-18 VITALS — BP 122/72 | HR 87 | Temp 98.0°F | Resp 16 | Ht 68.0 in | Wt 153.2 lb

## 2024-05-18 DIAGNOSIS — Z0000 Encounter for general adult medical examination without abnormal findings: Secondary | ICD-10-CM

## 2024-05-18 DIAGNOSIS — R739 Hyperglycemia, unspecified: Secondary | ICD-10-CM

## 2024-05-18 DIAGNOSIS — E7800 Pure hypercholesterolemia, unspecified: Secondary | ICD-10-CM

## 2024-05-18 LAB — LIPID PANEL
Cholesterol: 258 mg/dL — ABNORMAL HIGH (ref 0–200)
HDL: 64.3 mg/dL (ref >39.00)
LDL Cholesterol: 175 mg/dL — ABNORMAL HIGH (ref 0–99)
NonHDL: 193.78
Total CHOL/HDL Ratio: 4
Triglycerides: 96 mg/dL (ref 0.0–149.0)
VLDL: 19.2 mg/dL (ref 0.0–40.0)

## 2024-05-18 LAB — CBC
HCT: 45.8 % (ref 39.0–52.0)
Hemoglobin: 15.1 g/dL (ref 13.0–17.0)
MCHC: 32.9 g/dL (ref 30.0–36.0)
MCV: 84.5 fl (ref 78.0–100.0)
Platelets: 215 K/uL (ref 150.0–400.0)
RBC: 5.42 Mil/uL (ref 4.22–5.81)
RDW: 13.7 % (ref 11.5–15.5)
WBC: 3.9 K/uL — ABNORMAL LOW (ref 4.0–10.5)

## 2024-05-18 LAB — COMPREHENSIVE METABOLIC PANEL WITH GFR
ALT: 21 U/L (ref 0–53)
AST: 18 U/L (ref 0–37)
Albumin: 4.7 g/dL (ref 3.5–5.2)
Alkaline Phosphatase: 36 U/L — ABNORMAL LOW (ref 39–117)
BUN: 16 mg/dL (ref 6–23)
CO2: 30 meq/L (ref 19–32)
Calcium: 9.6 mg/dL (ref 8.4–10.5)
Chloride: 101 meq/L (ref 96–112)
Creatinine, Ser: 1.07 mg/dL (ref 0.40–1.50)
GFR: 87.3 mL/min (ref >60.00)
Glucose, Bld: 103 mg/dL — ABNORMAL HIGH (ref 70–99)
Potassium: 4.4 meq/L (ref 3.5–5.1)
Sodium: 140 meq/L (ref 135–145)
Total Bilirubin: 0.6 mg/dL (ref 0.2–1.2)
Total Protein: 7.3 g/dL (ref 6.0–8.3)

## 2024-05-18 NOTE — Patient Instructions (Addendum)
 Give us  2-3 business days to get the results of your labs back.   Keep the diet clean and stay active.  Try to increase the frequency of your exercising.  Please get me a copy of your advanced directive form at your convenience.   Do monthly self testicular checks in the shower. You are feeling for lumps/bumps that don't belong. If you feel anything like this, let me know!  I recommend getting the flu shot in mid October. This suggestion would change if the CDC comes out with a different recommendation.   Try using a pumice stone or foot file after you wash. Do this for the next 2 weeks. This should help break down the skin buildup. If it continues to recur, consider aspirin paste. Grind up some aspirin, mix with water, and put it over the areas of interest for 10-15 min daily. If still there after 2 more weeks, send me a message and we will get you in with a foot specialist.   Let us  know if you need anything.

## 2024-05-18 NOTE — Progress Notes (Signed)
 Chief Complaint  Patient presents with   Annual Exam    CPE    Well Male Bradley Tucker is here for a complete physical.   His last physical was >1 year ago.  Current diet: in general, a healthy diet.   Current exercise: walking, running, lifting wts Weight trend: stable Fatigue out of ordinary? No. Seat belt? Yes.   Advanced directive? No  Health maintenance Tetanus- Yes HIV- Yes Hep C- Yes  Past Medical History:  Diagnosis Date   Chronic idiopathic urticaria    GERD (gastroesophageal reflux disease)    IBS (irritable bowel syndrome)      Past Surgical History:  Procedure Laterality Date   [place]COPY  09/2017   [place]   ESOPHAGOGASTRODUODENOSCOPY  06/24/2017   Mild gastritis. Otherwise normal EGD    Medications  Current Outpatient Medications on File Prior to Visit  Medication Sig Dispense Refill   levocetirizine (XYZAL ) 5 MG tablet TAKE 1 TABLET BY MOUTH TWICE A DAY 180 tablet 0    Allergies No Known Allergies  Family History Family History  Problem Relation Age of Onset   Asthma Mother    Colon cancer Neg Hx    Esophageal cancer Neg Hx     Review of Systems: Constitutional: no fevers or chills Eye:  no recent significant change in vision Ear/Nose/Mouth/Throat:  Ears:  no hearing loss Nose/Mouth/Throat:  no complaints of nasal congestion, no sore throat Cardiovascular:  no chest pain Respiratory:  no shortness of breath Gastrointestinal:  no abdominal pain, no change in bowel habits GU:  Male: negative for dysuria Musculoskeletal/Extremities:  no pain of the joints Integumentary (Skin/Breast):  no abnormal skin lesions reported Neurologic:  no headaches Endocrine: No unexpected weight changes Hematologic/Lymphatic:  no night sweats  Exam BP 122/72 (BP Location: Left Arm, Patient Position: Sitting)   Pulse 87   Temp 98 F (36.7 C) (Oral)   Resp 16   Ht 5' 8 (1.727 m)   Wt 153 lb 3.2 oz (69.5 kg)   SpO2 99%   BMI 23.29 kg/m   General:  well developed, well nourished, in no apparent distress Skin:  no significant moles, warts, or growths Head:  no masses, lesions, or tenderness Eyes:  pupils equal and round, sclera anicteric without injection Ears:  canals without lesions, TMs shiny without retraction, no obvious effusion, no erythema Nose:  nares patent, mucosa normal Throat/Pharynx:  lips and gingiva without lesion; tongue and uvula midline; non-inflamed pharynx; no exudates or postnasal drainage Neck: neck supple without adenopathy, thyromegaly, or masses Lungs:  clear to auscultation, breath sounds equal bilaterally, no respiratory distress Cardio:  regular rate and rhythm, no bruits, no LE edema Abdomen:  abdomen soft, nontender; bowel sounds normal; no masses or organomegaly Genital (male): Deferred Rectal: Deferred Musculoskeletal:  symmetrical muscle groups noted without atrophy or deformity Extremities:  no clubbing, cyanosis, or edema, no deformities, no skin discoloration Neuro:  gait normal; deep tendon reflexes normal and symmetric Psych: well oriented with normal range of affect and appropriate judgment/insight  Assessment and Plan  Well adult exam - Plan: CBC, Comprehensive metabolic panel with GFR, Lipid panel, Hepatitis B surface antibody,quantitative   Well 38 y.o. male. Counseled on diet and exercise. Self testicular exams recommended at least monthly.  Advanced directive form requested today.  Flu shot rec'd for mid Oct.  Screen Hep B. Other orders as above. Follow up in 1 year pending the above workup. The patient voiced understanding and agreement to the plan.  Mabel Mt [place], DO 05/18/24 7:16 AM

## 2024-05-19 ENCOUNTER — Ambulatory Visit: Payer: Self-pay | Admitting: Family Medicine

## 2024-05-19 ENCOUNTER — Ambulatory Visit (INDEPENDENT_AMBULATORY_CARE_PROVIDER_SITE_OTHER)

## 2024-05-19 DIAGNOSIS — R7303 Prediabetes: Secondary | ICD-10-CM | POA: Insufficient documentation

## 2024-05-19 DIAGNOSIS — R739 Hyperglycemia, unspecified: Secondary | ICD-10-CM

## 2024-05-19 LAB — HEMOGLOBIN A1C: Hgb A1c MFr Bld: 5.9 % (ref 4.6–6.5)

## 2024-05-19 LAB — HEPATITIS B SURFACE ANTIBODY, QUANTITATIVE: Hep B S AB Quant (Post): 6 m[IU]/mL — ABNORMAL LOW (ref >=10)

## 2024-06-13 NOTE — Progress Notes (Unsigned)
 Chief Complaint:*** Primary GI Doctor:Dr. Charlanne  HPI:  Patient is a  38  year old male patient with past medical history of *****who was referred to me by Frann Mabel Mt* on **** for a evaluation of *** .   Patient last seen in GI office on 08/06/2020 by Dr. Charlanne  Interval History  Patient admits/denies GERD Patient admits/denies dysphagia Patient admits/denies nausea, vomiting, or weight loss  Patient admits/denies altered bowel habits Patient admits/denies abdominal pain Patient admits/denies rectal bleeding   Denies/Admits alcohol Denies/Admits smoking Denies/Admits NSAID use. Denies/Admits they are on blood thinners.   10/20/2017 colonoscopy External exam no fissure or fistula Very spastic sigmoid colon Otherwise normal exam  08/2020 gastric emptying study IMPRESSION: Normal gastric emptying study  11/2019 CTAP IMPRESSION: No acute findings identified within the abdomen or pelvis.     Surgical history:  Patient's family history includes  Wt Readings from Last 3 Encounters:  05/18/24 153 lb 3.2 oz (69.5 kg)  04/14/23 163 lb (73.9 kg)  10/18/22 153 lb (69.4 kg)      Past Medical History:  Diagnosis Date   Chronic idiopathic urticaria    GERD (gastroesophageal reflux disease)    IBS (irritable bowel syndrome)     Past Surgical History:  Procedure Laterality Date   [place]ON[place]COPY  09/2017   [place]   ESOPHAGOGASTRODUODENOSCOPY  06/24/2017   Mild gastritis. Otherwise normal EGD    Current Outpatient Medications  Medication Sig Dispense Refill   levocetirizine (XYZAL ) 5 MG tablet TAKE 1 TABLET BY MOUTH TWICE A DAY 180 tablet 0   No current facility-administered medications for this visit.    Allergies as of 06/14/2024   (No Known Allergies)    Family History  Problem Relation Age of Onset   Asthma Mother    Colon cancer Neg Hx    Esophageal cancer Neg Hx     Review of Systems:    Constitutional: No weight loss, fever,  chills, weakness or fatigue HEENT: Eyes: No change in vision               Ears, Nose, Throat:  No change in hearing or congestion Skin: No rash or itching Cardiovascular: No chest pain, chest pressure or palpitations   Respiratory: No SOB or cough Gastrointestinal: See HPI and otherwise negative Genitourinary: No dysuria or change in urinary frequency Neurological: No headache, dizziness or syncope Musculoskeletal: No new muscle or joint pain Hematologic: No bleeding or bruising Psychiatric: No history of depression or anxiety    Physical Exam:  Vital signs: There were no vitals taken for this visit.  Constitutional:   Pleasant *** male/male appears to be in NAD, Well developed, Well nourished, alert and cooperative Eyes:   PEERL, EOMI. No icterus. Conjunctiva pink. Neck:  Supple Throat: Oral cavity and pharynx without inflammation, swelling or lesion.  Respiratory: Respirations even and unlabored. Lungs clear to auscultation bilaterally.   No wheezes, crackles, or rhonchi.  Cardiovascular: Normal S1, S2. Regular rate and rhythm. No peripheral edema, cyanosis or pallor.  Gastrointestinal:  Soft, nondistended, nontender. No rebound or guarding. Normal bowel sounds. No appreciable masses or hepatomegaly. Rectal:  Not performed.  Anoscopy: Msk:  Symmetrical without gross deformities. Without edema, no deformity or joint abnormality.  Neurologic:  Alert and  oriented x4;  grossly normal neurologically.  Skin:   Dry and intact without significant lesions or rashes.  RELEVANT LABS AND IMAGING: CBC    Latest Ref Rng & Units 05/18/2024    7:25 AM  04/14/2023    3:08 PM 04/10/2022    1:54 PM  CBC  WBC 4.0 - 10.5 K/uL 3.9  4.2  4.1   Hemoglobin 13.0 - 17.0 g/dL 84.8  85.4  85.2   Hematocrit 39.0 - 52.0 % 45.8  44.9  43.7   Platelets 150.0 - 400.0 K/uL 215.0  221.0  207.0      CMP     Latest Ref Rng & Units 05/18/2024    7:25 AM 04/14/2023    3:08 PM 04/10/2022    1:54 PM  CMP   Glucose 70 - 99 mg/dL 896  83  80   BUN 6 - 23 mg/dL 16  23  24    Creatinine 0.40 - 1.50 mg/dL 8.92  8.69  8.92   Sodium 135 - 145 mEq/L 140  140  141   Potassium 3.5 - 5.1 mEq/L 4.4  4.6  4.3   Chloride 96 - 112 mEq/L 101  103  102   CO2 19 - 32 mEq/L 30  28  29    Calcium 8.4 - 10.5 mg/dL 9.6  9.7  9.6   Total Protein 6.0 - 8.3 g/dL 7.3  7.0  6.9   Total Bilirubin 0.2 - 1.2 mg/dL 0.6  0.3  0.3   Alkaline Phos 39 - 117 U/L 36  37  37   AST 0 - 37 U/L 18  18  18    ALT 0 - 53 U/L 21  31  28       No results found for: TSH   Assessment: 1. ***  Plan: 1. ***   Thank you for the courtesy of this consult. Please call me with any questions or concerns.   Shatasha Lambing, FNP-C [facility] 06/13/2024, 4:53 PM  Cc: Frann Mabel Mt*

## 2024-06-14 ENCOUNTER — Encounter: Payer: Self-pay | Admitting: Gastroenterology

## 2024-06-14 ENCOUNTER — Ambulatory Visit: Admitting: Gastroenterology

## 2024-06-14 ENCOUNTER — Other Ambulatory Visit: Payer: Self-pay | Admitting: Gastroenterology

## 2024-06-14 VITALS — BP 122/70 | HR 70 | Ht 68.0 in | Wt 149.2 lb

## 2024-06-14 DIAGNOSIS — K580 Irritable bowel syndrome with diarrhea: Secondary | ICD-10-CM

## 2024-06-14 DIAGNOSIS — Z8719 Personal history of other diseases of the digestive system: Secondary | ICD-10-CM

## 2024-06-14 DIAGNOSIS — R140 Abdominal distension (gaseous): Secondary | ICD-10-CM | POA: Diagnosis not present

## 2024-06-14 DIAGNOSIS — K638219 Small intestinal bacterial overgrowth, unspecified: Secondary | ICD-10-CM

## 2024-06-14 MED ORDER — HYOSCYAMINE SULFATE 0.125 MG SL SUBL: 0.1250 mg | SUBLINGUAL_TABLET | Freq: Three times a day (TID) | SUBLINGUAL | 0 refills | Status: AC | PRN

## 2024-06-14 MED ORDER — RIFAXIMIN 550 MG PO TABS: 550.0000 mg | ORAL_TABLET | Freq: Three times a day (TID) | ORAL | 2 refills | Status: DC

## 2024-06-14 NOTE — Patient Instructions (Addendum)
 Bloating Recommend low fodmap diet Hyoscyamine  prn for abdominal cramping and bloating during periods where symptoms are worse  Sibo Treat with xifaxan  for 2 weeks, two refills  Email me with updates on how you are doing  _______________________________________________________  If your blood pressure at your visit was 140/90 or greater, please contact your primary care physician to follow up on this.  _______________________________________________________  If you are age 24 or older, your body mass index should be between 23-30. Your Body mass index is 22.69 kg/m. If this is out of the aforementioned range listed, please consider follow up with your Primary Care Provider.  If you are age 19 or younger, your body mass index should be between 19-25. Your Body mass index is 22.69 kg/m. If this is out of the aformentioned range listed, please consider follow up with your Primary Care Provider.   ________________________________________________________  The [facility] providers would like to encourage you to use MYCHART to communicate with providers for non-urgent requests or questions.  Due to long hold times on the telephone, sending your provider a message by [facility] may be a faster and more efficient way to get a response.  Please allow 48 business hours for a response.  Please remember that this is for non-urgent requests.  _______________________________________________________  [facility] is using a team-based approach to care.  Your team is made up of your doctor and two to three APPS. Our APPS (Nurse Practitioners and Physician Assistants) work with your physician to ensure care continuity for you. They are fully qualified to address your health concerns and develop a treatment plan. They communicate directly with your gastroenterologist to care for you. Seeing the Advanced Practice Practitioners on your physician's team can help you by facilitating care more promptly,  often allowing for earlier appointments, access to diagnostic testing, procedures, and other specialty referrals.   Thank you for trusting me with your gastrointestinal care. Deanna May, FNP-C

## 2024-06-15 ENCOUNTER — Telehealth: Payer: Self-pay

## 2024-06-15 NOTE — Telephone Encounter (Signed)
 Pharmacy Patient Advocate Encounter   Received notification from CoverMyMeds that prior authorization for Xifaxan  550MG  tablets is required/requested.   Insurance verification completed.   The patient is insured through [facility] .   Per test claim: PA required; PA submitted to above mentioned insurance via Latent Key/confirmation #/[id number] Status is pending

## 2024-06-17 NOTE — Telephone Encounter (Signed)
 Pharmacy Patient Advocate Encounter  Received notification from [employer] that Prior Authorization for Xifaxan  550MG  tablets has been DENIED.  Full denial letter will be uploaded to the media tab. See denial reason below.  This request was denied because you did not meet the following requirements:   Based on the information provided, you do not meet  the established medication-specific criteria or guidelines for Xifaxan  at this time.   The request for coverage for XIFAXAN  TAB 550MG , use as directed (42 per 14 days), is denied. This decision is based on health plan criteria for XIFAXAN  TAB 550MG . This medicine is covered only if:  You have a history of failure, contraindication or intolerance to Viberzi*. The information provided does not show that you meet the criteria listed above.  *Please note: This product may require prior authorization  PA #/Case ID/Reference #: [id number]

## 2024-06-20 ENCOUNTER — Other Ambulatory Visit: Payer: Self-pay | Admitting: Gastroenterology

## 2024-06-20 DIAGNOSIS — K638219 Small intestinal bacterial overgrowth, unspecified: Secondary | ICD-10-CM

## 2024-06-20 MED ORDER — METRONIDAZOLE 250 MG PO TABS: 250.0000 mg | ORAL_TABLET | Freq: Three times a day (TID) | ORAL | 0 refills | Status: AC

## 2024-06-20 NOTE — Telephone Encounter (Signed)
 Pt made aware of the  Xifaxan  550MG  tablets has been DENIED.  Pt stated that he does not have any allergy to any antibiotic and does not have a preference.  Pt verbalized understanding with all questions answered.

## 2024-06-20 NOTE — Telephone Encounter (Signed)
 See message from Deanna

## 2024-06-20 NOTE — Telephone Encounter (Signed)
Pt made aware that prescription has been sent to pharmacy.  Pt verbalized understanding with all questions answered.

## 2024-06-20 NOTE — Progress Notes (Signed)
 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 Xifaxan  not covered

## 2024-06-28 ENCOUNTER — Other Ambulatory Visit (INDEPENDENT_AMBULATORY_CARE_PROVIDER_SITE_OTHER)

## 2024-06-28 ENCOUNTER — Ambulatory Visit (INDEPENDENT_AMBULATORY_CARE_PROVIDER_SITE_OTHER)

## 2024-06-28 DIAGNOSIS — Z23 Encounter for immunization: Secondary | ICD-10-CM

## 2024-06-28 DIAGNOSIS — E7800 Pure hypercholesterolemia, unspecified: Secondary | ICD-10-CM

## 2024-06-28 LAB — LIPID PANEL
Cholesterol: 199 mg/dL (ref <200)
HDL: 46 mg/dL (ref >=40)
LDL Cholesterol (Calc): 137 mg/dL — ABNORMAL HIGH
Non-HDL Cholesterol (Calc): 153 mg/dL — ABNORMAL HIGH (ref <130)
Total CHOL/HDL Ratio: 4.3 (calc) (ref <5.0)
Triglycerides: 70 mg/dL (ref <150)

## 2024-06-28 NOTE — Progress Notes (Signed)
 Pt in office today for Hep B vaccine per PCP. Vaccine was given in L deltoid and pt tolerated well. Pt is scheduled to come in for 2nd dose of vaccine on 07/29/2024.

## 2024-07-05 ENCOUNTER — Telehealth: Payer: Self-pay

## 2024-07-05 ENCOUNTER — Other Ambulatory Visit: Payer: Self-pay

## 2024-07-05 DIAGNOSIS — R197 Diarrhea, unspecified: Secondary | ICD-10-CM

## 2024-07-05 NOTE — Telephone Encounter (Signed)
 Pt made aware of Deanna May NP recommendations: Orders for labs placed in Epic. Pt made aware.  Stool kit prepared and ready to pick up on the second floor. Pt notified that there was an additional stool kit to pick up in the basement.  Pt verbalized understanding with all questions answered.

## 2024-07-05 NOTE — Telephone Encounter (Signed)
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

## 2024-07-06 ENCOUNTER — Other Ambulatory Visit

## 2024-07-06 DIAGNOSIS — R197 Diarrhea, unspecified: Secondary | ICD-10-CM

## 2024-07-08 NOTE — Telephone Encounter (Signed)
 [facility] results. Pending pancreatic elastase.

## 2024-07-14 ENCOUNTER — Other Ambulatory Visit: Payer: Self-pay

## 2024-07-14 ENCOUNTER — Ambulatory Visit: Payer: Self-pay | Admitting: Physician Assistant

## 2024-07-14 DIAGNOSIS — K8681 Exocrine pancreatic insufficiency: Secondary | ICD-10-CM

## 2024-07-14 DIAGNOSIS — R197 Diarrhea, unspecified: Secondary | ICD-10-CM

## 2024-07-14 DIAGNOSIS — R140 Abdominal distension (gaseous): Secondary | ICD-10-CM

## 2024-07-14 LAB — PANCREATIC ELASTASE, FECAL: Pancreatic Elastase-1, Stool: 16 ug/g — ABNORMAL LOW (ref >200)

## 2024-07-14 MED ORDER — PANCRELIPASE (LIP-PROT-AMYL) 36000-114000 UNITS PO CPEP: ORAL_CAPSULE | ORAL | Status: DC

## 2024-07-15 ENCOUNTER — Other Ambulatory Visit

## 2024-07-18 ENCOUNTER — Other Ambulatory Visit

## 2024-07-18 ENCOUNTER — Telehealth: Payer: Self-pay

## 2024-07-18 DIAGNOSIS — R197 Diarrhea, unspecified: Secondary | ICD-10-CM

## 2024-07-18 DIAGNOSIS — R140 Abdominal distension (gaseous): Secondary | ICD-10-CM

## 2024-07-19 ENCOUNTER — Encounter: Payer: Self-pay | Admitting: Gastroenterology

## 2024-07-26 ENCOUNTER — Ambulatory Visit: Payer: Self-pay | Admitting: Physician Assistant

## 2024-07-26 LAB — FECAL FAT, QUALITATIVE
Fat Qual Neutral, Stl: NORMAL
Fat Qual Total, Stl: NORMAL

## 2024-07-29 ENCOUNTER — Ambulatory Visit (INDEPENDENT_AMBULATORY_CARE_PROVIDER_SITE_OTHER)

## 2024-07-29 ENCOUNTER — Ambulatory Visit

## 2024-07-29 DIAGNOSIS — Z23 Encounter for immunization: Secondary | ICD-10-CM

## 2024-07-29 NOTE — Progress Notes (Signed)
 Pt here today for Hep B #2 per Dr. Frann.   Heplisav-B 0.5mL injected into L deltoid. Pt tolerated injection well.

## 2024-08-09 MED ORDER — PANCRELIPASE (LIP-PROT-AMYL) 36000-114000 UNITS PO CPEP: ORAL_CAPSULE | ORAL | 2 refills | Status: DC

## 2024-08-23 ENCOUNTER — Other Ambulatory Visit: Payer: Self-pay

## 2024-08-23 ENCOUNTER — Other Ambulatory Visit: Payer: Self-pay | Admitting: Gastroenterology

## 2024-08-23 DIAGNOSIS — K8681 Exocrine pancreatic insufficiency: Secondary | ICD-10-CM

## 2024-08-23 MED ORDER — PANCRELIPASE (LIP-PROT-AMYL) 36000-114000 UNITS PO CPEP: ORAL_CAPSULE | ORAL | 2 refills | Status: AC

## 2024-08-23 MED ORDER — PANCRELIPASE (LIP-PROT-AMYL) 36000-114000 UNITS PO CPEP: ORAL_CAPSULE | ORAL | 2 refills | Status: DC

## 2024-08-23 NOTE — Progress Notes (Signed)
 Resent creon 

## 2024-09-12 ENCOUNTER — Encounter: Payer: Self-pay | Admitting: Gastroenterology
